# Patient Record
Sex: Female | Born: 1975 | Race: Black or African American | Hispanic: No | Marital: Single | Smoking: Never smoker
Health system: Southern US, Community
[De-identification: ages and names within clinical notes are randomized; demographics above are authoritative.]

## PROBLEM LIST (undated history)

## (undated) DIAGNOSIS — R51 Headache: Secondary | ICD-10-CM

## (undated) DIAGNOSIS — I10 Essential (primary) hypertension: Secondary | ICD-10-CM

## (undated) HISTORY — PX: OVARIAN CYST REMOVAL: SHX89

## (undated) HISTORY — DX: Essential (primary) hypertension: I10

---

## 1996-01-12 HISTORY — PX: OOPHORECTOMY: SHX86

## 1997-10-26 ENCOUNTER — Emergency Department (HOSPITAL_COMMUNITY): Admission: EM | Admit: 1997-10-26 | Discharge: 1997-10-26 | Payer: Self-pay | Admitting: Emergency Medicine

## 1998-08-14 ENCOUNTER — Inpatient Hospital Stay (HOSPITAL_COMMUNITY): Admission: AD | Admit: 1998-08-14 | Discharge: 1998-08-14 | Payer: Self-pay | Admitting: Obstetrics & Gynecology

## 1998-08-15 ENCOUNTER — Ambulatory Visit (HOSPITAL_COMMUNITY): Admission: RE | Admit: 1998-08-15 | Discharge: 1998-08-15 | Payer: Self-pay | Admitting: Obstetrics & Gynecology

## 1998-08-15 ENCOUNTER — Encounter: Payer: Self-pay | Admitting: Obstetrics & Gynecology

## 1998-12-26 ENCOUNTER — Inpatient Hospital Stay (HOSPITAL_COMMUNITY): Admission: AD | Admit: 1998-12-26 | Discharge: 1998-12-26 | Payer: Self-pay | Admitting: Obstetrics & Gynecology

## 1999-05-26 ENCOUNTER — Emergency Department (HOSPITAL_COMMUNITY): Admission: EM | Admit: 1999-05-26 | Discharge: 1999-05-26 | Payer: Self-pay | Admitting: Emergency Medicine

## 1999-09-18 ENCOUNTER — Inpatient Hospital Stay (HOSPITAL_COMMUNITY): Admission: AD | Admit: 1999-09-18 | Discharge: 1999-09-18 | Payer: Self-pay | Admitting: Obstetrics & Gynecology

## 1999-09-23 ENCOUNTER — Inpatient Hospital Stay (HOSPITAL_COMMUNITY): Admission: RE | Admit: 1999-09-23 | Discharge: 1999-09-23 | Payer: Self-pay | Admitting: Obstetrics & Gynecology

## 1999-10-01 ENCOUNTER — Encounter: Admission: RE | Admit: 1999-10-01 | Discharge: 1999-10-01 | Payer: Self-pay | Admitting: Obstetrics

## 2000-11-23 ENCOUNTER — Other Ambulatory Visit: Admission: RE | Admit: 2000-11-23 | Discharge: 2000-11-23 | Payer: Self-pay | Admitting: *Deleted

## 2002-10-05 ENCOUNTER — Other Ambulatory Visit: Admission: RE | Admit: 2002-10-05 | Discharge: 2002-10-05 | Payer: Self-pay | Admitting: Gynecology

## 2003-05-01 ENCOUNTER — Other Ambulatory Visit: Admission: RE | Admit: 2003-05-01 | Discharge: 2003-05-01 | Payer: Self-pay | Admitting: Gynecology

## 2003-05-31 ENCOUNTER — Inpatient Hospital Stay (HOSPITAL_COMMUNITY): Admission: AD | Admit: 2003-05-31 | Discharge: 2003-05-31 | Payer: Self-pay | Admitting: Gynecology

## 2003-09-04 ENCOUNTER — Inpatient Hospital Stay (HOSPITAL_COMMUNITY): Admission: AD | Admit: 2003-09-04 | Discharge: 2003-09-04 | Payer: Self-pay | Admitting: Gynecology

## 2003-11-20 ENCOUNTER — Encounter (INDEPENDENT_AMBULATORY_CARE_PROVIDER_SITE_OTHER): Payer: Self-pay | Admitting: *Deleted

## 2003-11-20 ENCOUNTER — Inpatient Hospital Stay (HOSPITAL_COMMUNITY): Admission: RE | Admit: 2003-11-20 | Discharge: 2003-11-23 | Payer: Self-pay | Admitting: Gynecology

## 2004-01-17 ENCOUNTER — Other Ambulatory Visit: Admission: RE | Admit: 2004-01-17 | Discharge: 2004-01-17 | Payer: Self-pay | Admitting: Gynecology

## 2004-04-08 ENCOUNTER — Emergency Department (HOSPITAL_COMMUNITY): Admission: EM | Admit: 2004-04-08 | Discharge: 2004-04-08 | Payer: Self-pay | Admitting: Family Medicine

## 2004-11-08 ENCOUNTER — Inpatient Hospital Stay (HOSPITAL_COMMUNITY): Admission: AD | Admit: 2004-11-08 | Discharge: 2004-11-10 | Payer: Self-pay | Admitting: *Deleted

## 2004-11-08 ENCOUNTER — Ambulatory Visit: Payer: Self-pay | Admitting: *Deleted

## 2004-11-12 ENCOUNTER — Inpatient Hospital Stay (HOSPITAL_COMMUNITY): Admission: AD | Admit: 2004-11-12 | Discharge: 2004-11-12 | Payer: Self-pay | Admitting: Obstetrics & Gynecology

## 2005-11-16 ENCOUNTER — Emergency Department (HOSPITAL_COMMUNITY): Admission: EM | Admit: 2005-11-16 | Discharge: 2005-11-16 | Payer: Self-pay | Admitting: Emergency Medicine

## 2006-09-09 ENCOUNTER — Other Ambulatory Visit: Admission: RE | Admit: 2006-09-09 | Discharge: 2006-09-09 | Payer: Self-pay | Admitting: Gynecology

## 2007-11-13 ENCOUNTER — Encounter: Payer: Self-pay | Admitting: Women's Health

## 2007-11-13 ENCOUNTER — Ambulatory Visit: Payer: Self-pay | Admitting: Women's Health

## 2007-11-13 ENCOUNTER — Other Ambulatory Visit: Admission: RE | Admit: 2007-11-13 | Discharge: 2007-11-13 | Payer: Self-pay | Admitting: Gynecology

## 2008-02-21 ENCOUNTER — Emergency Department (HOSPITAL_COMMUNITY): Admission: EM | Admit: 2008-02-21 | Discharge: 2008-02-21 | Payer: Self-pay | Admitting: Family Medicine

## 2008-07-11 ENCOUNTER — Ambulatory Visit: Payer: Self-pay | Admitting: Women's Health

## 2008-08-15 ENCOUNTER — Ambulatory Visit: Payer: Self-pay | Admitting: Women's Health

## 2008-12-01 ENCOUNTER — Emergency Department (HOSPITAL_COMMUNITY): Admission: EM | Admit: 2008-12-01 | Discharge: 2008-12-01 | Payer: Self-pay | Admitting: Family Medicine

## 2008-12-03 ENCOUNTER — Emergency Department (HOSPITAL_COMMUNITY): Admission: EM | Admit: 2008-12-03 | Discharge: 2008-12-03 | Payer: Self-pay | Admitting: Emergency Medicine

## 2009-02-13 ENCOUNTER — Other Ambulatory Visit: Admission: RE | Admit: 2009-02-13 | Discharge: 2009-02-13 | Payer: Self-pay | Admitting: Gynecology

## 2009-02-13 ENCOUNTER — Ambulatory Visit: Payer: Self-pay | Admitting: Women's Health

## 2009-04-24 ENCOUNTER — Ambulatory Visit: Payer: Self-pay | Admitting: Women's Health

## 2009-06-25 ENCOUNTER — Ambulatory Visit: Payer: Self-pay | Admitting: Women's Health

## 2009-11-18 ENCOUNTER — Inpatient Hospital Stay (HOSPITAL_COMMUNITY): Admission: AD | Admit: 2009-11-18 | Discharge: 2009-11-18 | Payer: Self-pay | Admitting: Obstetrics & Gynecology

## 2009-11-18 ENCOUNTER — Ambulatory Visit: Payer: Self-pay | Admitting: Nurse Practitioner

## 2010-03-25 LAB — URINALYSIS, ROUTINE W REFLEX MICROSCOPIC
Bilirubin Urine: NEGATIVE
Glucose, UA: NEGATIVE mg/dL
Ketones, ur: NEGATIVE mg/dL
Leukocytes, UA: NEGATIVE
Nitrite: NEGATIVE
Protein, ur: NEGATIVE mg/dL
Specific Gravity, Urine: 1.025 (ref 1.005–1.030)
Urobilinogen, UA: 1 mg/dL (ref 0.0–1.0)
pH: 6 (ref 5.0–8.0)

## 2010-03-25 LAB — COMPREHENSIVE METABOLIC PANEL
BUN: 5 mg/dL — ABNORMAL LOW (ref 6–23)
CO2: 25 mEq/L (ref 19–32)
Chloride: 105 mEq/L (ref 96–112)
Creatinine, Ser: 0.61 mg/dL (ref 0.4–1.2)
GFR calc non Af Amer: >60 mL/min (ref >60)
Glucose, Bld: 86 mg/dL (ref 70–99)
Total Bilirubin: 0.2 mg/dL — ABNORMAL LOW (ref 0.3–1.2)

## 2010-03-25 LAB — CBC
Hemoglobin: 12.8 g/dL (ref 12.0–15.0)
MCH: 29.1 pg (ref 26.0–34.0)
MCV: 87.5 fL (ref 78.0–100.0)
RBC: 4.39 MIL/uL (ref 3.87–5.11)

## 2010-03-25 LAB — URINE MICROSCOPIC-ADD ON

## 2010-03-25 LAB — DIFFERENTIAL
Basophils Absolute: 0 10*3/uL (ref 0.0–0.1)
Eosinophils Relative: 7 % — ABNORMAL HIGH (ref 0–5)
Lymphocytes Relative: 35 % (ref 12–46)
Neutro Abs: 2.5 10*3/uL (ref 1.7–7.7)
Neutrophils Relative %: 47 % (ref 43–77)

## 2010-03-25 LAB — WET PREP, GENITAL
Trich, Wet Prep: NONE SEEN
Yeast Wet Prep HPF POC: NONE SEEN

## 2010-03-25 LAB — GC/CHLAMYDIA PROBE AMP, GENITAL: GC Probe Amp, Genital: NEGATIVE

## 2010-04-15 LAB — POCT CARDIAC MARKERS
CKMB, poc: <1 ng/mL — ABNORMAL LOW (ref 1.0–8.0)
Myoglobin, poc: 32 ng/mL (ref 12–200)
Troponin i, poc: <0.05 ng/mL (ref 0.00–0.09)

## 2010-04-15 LAB — POCT I-STAT, CHEM 8
BUN: 12 mg/dL (ref 6–23)
BUN: 5 mg/dL — ABNORMAL LOW (ref 6–23)
Calcium, Ion: 0.93 mmol/L — ABNORMAL LOW (ref 1.12–1.32)
Chloride: 106 mEq/L (ref 96–112)
Creatinine, Ser: 0.8 mg/dL (ref 0.4–1.2)
Creatinine, Ser: 0.8 mg/dL (ref 0.4–1.2)
Glucose, Bld: 88 mg/dL (ref 70–99)
HCT: 41 % (ref 36.0–46.0)
Hemoglobin: 13.9 g/dL (ref 12.0–15.0)
Potassium: 3.6 mEq/L (ref 3.5–5.1)
Sodium: 136 mEq/L (ref 135–145)
Sodium: 140 mEq/L (ref 135–145)
TCO2: 22 mmol/L (ref 0–100)
TCO2: 24 mmol/L (ref 0–100)

## 2010-05-29 NOTE — Op Note (Signed)
NAMEMARCIANNA, Kerry Salazar         ACCOUNT NO.:  [id number]   MEDICAL RECORD NO.:  [id number]          PATIENT TYPE:  INP   LOCATION:  9128                          FACILITY:  [facility]   PHYSICIAN:  Timothy P. Fontaine, M.D.DATE OF BIRTH:  1975/12/28   DATE OF PROCEDURE:  11/20/2003  DATE OF DISCHARGE:                                 OPERATIVE REPORT   PREOPERATIVE DIAGNOSES:  Term pregnancy, pregnancy induced hypertension,  nonreassuring fetal heart rate tracing.   POSTOPERATIVE DIAGNOSES:  Term pregnancy, pregnancy induced hypertension,  nonreassuring fetal heart rate tracing.   PROCEDURE:  Primary low transverse cervical cesarean section.   SURGEON:  Timothy P. Fontaine, M.D.   ASSISTANT:  Scrub technician.   ANESTHESIA:  Epidural.   ESTIMATED BLOOD LOSS:  Less than 500 mL.   COMPLICATIONS:  None.   SPECIMENS:  Samples of cord blood and placenta.   FINDINGS:  At 76 normal female, Apgar's 9 and 9, weight 7 pounds 7 ounces,  nuchal cord x1 noted. Right fallopian tube and ovary appear absent on  inspection.  Left fallopian tube and ovary grossly normal. The patient has a  history of questionable dermoid on the left and inspection of the ovary  shows no gross evidence of cystic changes.   DESCRIPTION OF PROCEDURE:  The patient was taken to the operating room,  underwent dosing of her epidural catheter, placed in the left tilt supine  position, received an abdominal preparation with Betadine solution. A Foley  catheter had been previously placed on labor and delivery. The patient was  draped in the usual fashion. The patient's abdomen was sharply entered  through a repeat midline incision excising the old scar upon entry.  The  bladder flap was sharply and bluntly developed without difficulty. The  uterus was sharply entered and the lower uterine segment bluntly extended  laterally. The bulging membranes were ruptured, fluid noted to be clear. The  infant's head delivered  through the incision, nares and mouth suctioned, the  nuchal cord was reduced, the rest of the infant delivered, cord doubly  clamped and cut and the infant handed to pediatric's in attendance. Samples  of cord blood were obtained, placenta was spontaneously extruded and noted  to be intact and was sent to pathology. The patient received 1 g of Ancef  antibiotic prophylaxis at this time. The uterine cavity was explored with a  sponge to remove all placental membrane fragments.  The uterus was  exteriorized and the uterine incision was closed in one layer using #0  Vicryl suture in a running interlocking stitch. A figure-of-eight  interrupted suture was subsequently placed to achieve hemostasis.  Inspection of the adnexa was then performed. She had an absent right  fallopian tube and ovary. The left fallopian tube and ovary were grossly  normal. There was no evidence of dermoid as previously found on ultrasound  and no further investigation was undertaken at this time. The uterus was  returned to the abdomen which was copiously irrigated, adequate hemostasis  visualized the anterior fascia reapproximated using #0  PDS in a running stitch. The subcutaneous tissue was irrigated, the  subcutaneous tissues  were reapproximated using 3-0 chromic in interrupted  stitch and the skin was reapproximated with staples.  A sterile dressing  applied. The patient taken to the recovery room in good condition having  tolerated the procedure well.     Timo   TPF/MEDQ  D:  11/20/2003  T:  11/20/2003  Job:  [id number]

## 2010-05-29 NOTE — Discharge Summary (Signed)
Kerry Salazar, Kerry Salazar         ACCOUNT NO.:  [id number]   MEDICAL RECORD NO.:  [id number]          PATIENT TYPE:  INP   LOCATION:  9128                          [facility]  [facility]   PHYSICIAN:  Juan H. Lily Peer, M.D.DATE OF BIRTH:  1975/10/15   DATE OF ADMISSION:  11/20/2003  DATE OF DISCHARGE:  11/23/2003                                 DISCHARGE SUMMARY   HISTORY:  The patient is a 35 year old gravida 4, para 1 admitted for  induction as a result of labile PIH at 40-1/2 weeks' gestation.  The patient  underwent a primary cesarean section as a result of a non-reassuring fetal  heart rate tracing by Dr. Colin Broach on November 20, 2003.  The patient  delivered a viable female infant with Apgars of 9 and 9 and intraoperative  findings of what appears to be a right dermoid cyst.  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  The  patient will be followed up 6 weeks later postpartum.  Hospitalization was  essentially unremarkable.  After 24 hours, the Foley was removed.  Her  hemoglobin was 7.3, preop was 8.6.  She was ambulating and was asymptomatic.  Her lochia continued to decrease.  On postop day #2, she continued to remain  afebrile.  Her diet was advanced.  On the third postoperative day, she was  ready to be discharged home.  The midline incision was intact.  She will be  discharged home today with removal of her staples in 48 hours in the office.  Her blood type is B positive, rubella immune.  She was instructed to  continue her prenatal vitamins and iron, and she was given a prescription  for Tylox to take 1 p.o. q.4-6 h. p.r.n. pain and follow up in 6 weeks with  an ultrasound to assess that left ovarian cyst.     Juan   JHF/MEDQ  D:  11/23/2003  T:  11/23/2003  Job:  [id number]

## 2010-05-29 NOTE — Consult Note (Signed)
NAME:  Kerry Salazar, SWINT                   ACCOUNT NO.:  [id number]   MEDICAL RECORD NO.:  [id number]                   PATIENT TYPE:  MAT   LOCATION:  MATC                                 [facility]ACILITY:  [facility]   PHYSICIAN:  Timothy P. Fontaine, M.D.           DATE OF BIRTH:  Jul 14, 1975   DATE OF CONSULTATION:  09/04/2003  DATE OF DISCHARGE:                                   CONSULTATION   OB/GYN CONSULTATION - EMERGENCY ROOM   CHIEF COMPLAINT:  Abdominal pain.   HISTORY OF PRESENT ILLNESS:  The patient is a 35 year old G12, P50, female at  [redacted] weeks gestation who had the acute onset of upper mid abdominal pain while  she was driving.  The patient notes it was sharp, stabbing which led to her  emergency room presentation.  The patient does note that this lasted for  approximately 20 to 30 minutes and about the time she arrived at the  emergency department her pain had resolved.  She had no nausea or vomiting.  She had no history of trauma.  No vaginal bleeding, contractions or  cramping.  No association with eating.  The patient's pregnancy has been  uncomplicated to date.   PAST MEDICAL HISTORY:  For the remainder of the patient's past medical  history, past obstetrical history, see her [place] form.   PHYSICAL EXAMINATION:  VITAL SIGNS:  Afebrile and vital signs are stable.  HEENT:  Normal.  LUNGS: Clear.  CARDIAC:  Regular rate without murmurs, rubs or gallops.  ABDOMEN:  Benign noting abdomen shows active bowel sounds, is soft,  nontender without rebound or guarding or organomegaly. Her uterus is soft,  again without tenderness.  PELVIC:  Deferred.  Her external monitor showed no regular contractions with  a reactive fetal tracing.   ASSESSMENT/PLAN:  35 year old with transient mid upper epigastric  discomfort, I suspect secondary to gas, quickly resolved with no residual  discomfort, no vaginal bleeding or contractions to suggest obstetrically  related.  Her fetus is  reactive.  Pelvic was not done given her history.  For completeness, will check a Kleihauer-Betke and obstetrical ultrasound to  rule out placental abnormalities and also a cervical length check as a  baseline.  Assuming these are negative, will discharge her home with return  precautions.                                               Timothy P. Audie Box, M.D.    TPF/MEDQ  D:  09/04/2003  T:  09/04/2003  Job:  [id number]

## 2010-05-29 NOTE — H&P (Signed)
Kerry Salazar, Kerry Salazar         ACCOUNT NO.:  [id number]   MEDICAL RECORD NO.:  [id number]           PATIENT TYPE:   LOCATION:                                 FACILITY:   PHYSICIAN:  Ivor Costa. Farrel Gobble, M.D. DATE OF BIRTH:  [birth year]   DATE OF ADMISSION:  DATE OF DISCHARGE:                                HISTORY & PHYSICAL   CHIEF COMPLAINT:  40 plus weeks, elevated blood pressure.   HISTORY OF PRESENT ILLNESS:  The patient is a 35 year old, G4, P10 with an  LMP of February 20, 2003, estimated date of confinement November 18, 2003,  estimated gestational age of 17 2/7 weeks who has been followed in the  office since 36 weeks with NST's and AFI's because of elevated blood  pressure.  At that point, her blood pressure was 154/96 and 132/96. The  patient had PIH labs which were normal. She was taken out of work and  followed with heightened surveillance.  The patient is without any  complaints, no headaches, blurred vision, nausea or vomiting at this point.  She reports good fetal movement. She is B positive, antibody negative, RPR  nonreactive, rubella immune, hepatitis B surface antigen nonreactive, HIV  nonreactive and GBS positive.  Refer to the [place].   PHYSICAL EXAMINATION:  GENERAL:  She is a well appearing gravida in no acute  distress. Her weight is 190 which is a 47 pound weight gain.  Her urine dip  was negative.  VITAL SIGNS:  Her blood pressure is 124/88.  HEART:  Regular rate.  LUNGS:  Clear to auscultation.  ABDOMEN:  Soft, nontender with no right upper quadrant pain.  PELVIC:  Vaginal exam she was 2-3, 50% and -2.  EXTREMITIES:  No edema. Nonstress test was reactive.   ASSESSMENT:  Term pregnancy with elevated blood pressures, favorable cervix.  She will be admitted for high dose Pitocin and rupture on the 9th.     Trac   THL/MEDQ  D:  11/19/2003  T:  11/19/2003  Job:  [id number]

## 2010-11-10 DIAGNOSIS — Z634 Disappearance and death of family member: Secondary | ICD-10-CM | POA: Insufficient documentation

## 2010-11-10 DIAGNOSIS — I10 Essential (primary) hypertension: Secondary | ICD-10-CM | POA: Insufficient documentation

## 2010-11-12 ENCOUNTER — Encounter: Payer: Self-pay | Admitting: Women's Health

## 2011-11-07 ENCOUNTER — Encounter (HOSPITAL_COMMUNITY): Payer: Self-pay | Admitting: Emergency Medicine

## 2011-11-07 ENCOUNTER — Emergency Department (HOSPITAL_COMMUNITY): Payer: Medicaid Other

## 2011-11-07 ENCOUNTER — Emergency Department (HOSPITAL_COMMUNITY)
Admission: EM | Admit: 2011-11-07 | Discharge: 2011-11-07 | Disposition: A | Discharge status: Home / Self Care | Payer: Medicaid Other | Attending: Emergency Medicine | Admitting: Emergency Medicine

## 2011-11-07 DIAGNOSIS — I10 Essential (primary) hypertension: Secondary | ICD-10-CM | POA: Insufficient documentation

## 2011-11-07 DIAGNOSIS — Z9079 Acquired absence of other genital organ(s): Secondary | ICD-10-CM | POA: Insufficient documentation

## 2011-11-07 DIAGNOSIS — R269 Unspecified abnormalities of gait and mobility: Secondary | ICD-10-CM | POA: Insufficient documentation

## 2011-11-07 DIAGNOSIS — M25569 Pain in unspecified knee: Secondary | ICD-10-CM

## 2011-11-07 MED ORDER — IBUPROFEN 600 MG PO TABS: 600.0000 mg | ORAL_TABLET | Freq: Four times a day (QID) | ORAL | Status: DC | PRN

## 2011-11-07 MED ORDER — IBUPROFEN 400 MG PO TABS
600.0000 mg | ORAL_TABLET | Freq: Once | ORAL | Status: AC
  Administered 2011-11-07: 600 mg via ORAL
  Filled 2011-11-07: qty 1

## 2011-11-07 MED ORDER — HYDROCODONE-ACETAMINOPHEN 5-325 MG PO TABS: ORAL_TABLET | ORAL | Status: DC

## 2011-11-07 NOTE — ED Provider Notes (Signed)
History     CSN: [id number]  Arrival date & time 11/07/11  4098   First MD Initiated Contact with Patient 11/07/11 [phone]      Chief Complaint  Patient presents with  . Knee Pain    Left knee    (Consider location/radiation/quality/duration/timing/severity/associated sxs/prior treatment) HPI Comments: Patient presents with complaint of left knee pain that began yesterday morning when she awoke. Patient denies injury to the knee. Patient states pain is located in the medial aspect of her joint. She denies any swelling or redness of the knee. She denies fever history of diabetes. Patient states that the pain is much worse to bear weight and walk on it. No treatments prior to arrival. Nothing makes the pain better. Onset was acute. Course is constant. Patient does not have a history of knee problems.  The history is provided by the patient.    Past Medical History  Diagnosis Date  . Hypertension     Past Surgical History  Procedure Date  . Oophorectomy 1998    RSO    Family History  Problem Relation Age of Onset  . Heart defect Son     History  Substance Use Topics  . Smoking status: Unknown If Ever Smoked  . Smokeless tobacco: Not on file  . Alcohol Use: No    OB History    Grav Para Term Preterm Abortions TAB SAB Ect Mult Living   5 3   2     2       Review of Systems  Constitutional: Negative for activity change.  HENT: Negative for neck pain.   Musculoskeletal: Positive for arthralgias and gait problem. Negative for back pain and joint swelling.  Skin: Negative for wound.  Neurological: Negative for weakness and numbness.    Allergies  Review of patient's allergies indicates no known allergies.  Home Medications   Current Outpatient Rx  Name Route Sig Dispense Refill  . LISINOPRIL-HYDROCHLOROTHIAZIDE PO Oral Take 1 tablet by mouth daily.      BP 115/74  Pulse 101  Temp 98.2 F (36.8 C) (Oral)  Resp 18  SpO2 100%  LMP 11/07/2011  Physical Exam   Nursing note and vitals reviewed. Constitutional: She appears well-developed and well-nourished.  HENT:  Head: Normocephalic and atraumatic.  Eyes: Pupils are equal, round, and reactive to light.  Neck: Normal range of motion. Neck supple.  Cardiovascular: Exam reveals no decreased pulses.   Pulses:      Dorsalis pedis pulses are 2+ on the right side, and 2+ on the left side.       Posterior tibial pulses are 2+ on the right side, and 2+ on the left side.  Musculoskeletal: She exhibits tenderness. She exhibits no edema.       Left hip: Normal.       Left knee: She exhibits normal range of motion, no swelling and no effusion. tenderness found. Medial joint line tenderness noted. No lateral joint line and no patellar tendon tenderness noted.       Left ankle: Normal.  Neurological: She is alert. No sensory deficit.       Motor, sensation, and vascular distal to the injury is fully intact.   Skin: Skin is warm and dry.  Psychiatric: She has a normal mood and affect.    ED Course  Procedures (including critical care time)  Labs Reviewed - No data to display Dg Knee Complete 4 Views Left  11/07/2011  *RADIOLOGY REPORT*  Clinical Data: The medial left  knee pain with walking.  No known injury.  LEFT KNEE - COMPLETE 4+ VIEW  Comparison: None.  Findings: Focal area sclerosis and lucency in the medial femoral condyle suggesting osteochondral defect.  Small exostosis arising from the medial tibial metaphysis.  No evidence of acute fracture or subluxation.  Bone cortex and trabecular architecture appear intact.  No significant effusion.  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 Benign-appearing exostosis in the medial tibial metaphysis.  No acute fracture.   Original Report Authenticated By: Marlon Pel, M.D.      1. Knee pain     4:18 AM Patient seen and examined. X-ray results reviewed. Patient informed of degenerative findings. Crutches and knee  sleeve per ortho tech.   Vital signs reviewed and are as follows: Filed Vitals:   11/07/11 0055  BP: 115/74  Pulse: 101  Temp: 98.2 F (36.8 C)  Resp: 18   Patient to do trial of conservative management. If she is not noticing improvement, she is to follow-up with ortho. ,  Patient verbalizes understanding and agrees with plan.   Patient counseled on use of narcotic pain medications. Counseled not to combine these medications with others containing tylenol. Urged not to drink alcohol, drive, or perform any other activities that requires focus while taking these medications. The patient verbalizes understanding and agrees with the plan.     MDM  Knee pain, no acute injury. Pain may correspond to 'osteochondral defect'. Conservative mgmt with ortho f/u if not improving.         Renne Crigler, [place] 11/08/11 [phone]

## 2011-11-07 NOTE — ED Provider Notes (Signed)
Medical screening examination/treatment/procedure(s) were performed by non-physician practitioner and as supervising physician I was immediately available for consultation/collaboration.   Brandt Loosen, MD 11/07/11 [phone]

## 2011-11-07 NOTE — Progress Notes (Signed)
Orthopedic Tech Progress Note Patient Details:  Kerry Salazar 11/15/1975 [id number]  Ortho Devices Type of Ortho Device: Crutches;Knee Sleeve   [name] 11/07/2011, 4:38 AM

## 2011-11-07 NOTE — ED Notes (Signed)
Pt states pain started in her L knee yesterday suddenly. Reports pain of 10/10 when she puts pressure on L knee. Pt denies injury.

## 2012-07-02 ENCOUNTER — Inpatient Hospital Stay (HOSPITAL_COMMUNITY): Payer: Medicaid Other

## 2012-07-02 ENCOUNTER — Encounter (HOSPITAL_COMMUNITY): Payer: Self-pay | Admitting: *Deleted

## 2012-07-02 ENCOUNTER — Inpatient Hospital Stay (HOSPITAL_COMMUNITY)
Admission: AD | Admit: 2012-07-02 | Discharge: 2012-07-02 | Disposition: A | Discharge status: Home / Self Care | Payer: Medicaid Other | Source: Ambulatory Visit | Attending: Obstetrics & Gynecology | Admitting: Obstetrics & Gynecology

## 2012-07-02 DIAGNOSIS — N949 Unspecified condition associated with female genital organs and menstrual cycle: Secondary | ICD-10-CM | POA: Insufficient documentation

## 2012-07-02 DIAGNOSIS — N760 Acute vaginitis: Secondary | ICD-10-CM | POA: Insufficient documentation

## 2012-07-02 DIAGNOSIS — B9689 Other specified bacterial agents as the cause of diseases classified elsewhere: Secondary | ICD-10-CM | POA: Insufficient documentation

## 2012-07-02 DIAGNOSIS — A499 Bacterial infection, unspecified: Secondary | ICD-10-CM | POA: Insufficient documentation

## 2012-07-02 DIAGNOSIS — N83209 Unspecified ovarian cyst, unspecified side: Secondary | ICD-10-CM | POA: Insufficient documentation

## 2012-07-02 DIAGNOSIS — N938 Other specified abnormal uterine and vaginal bleeding: Secondary | ICD-10-CM | POA: Insufficient documentation

## 2012-07-02 HISTORY — DX: Headache: R51

## 2012-07-02 LAB — WET PREP, GENITAL
Trich, Wet Prep: NONE SEEN
Yeast Wet Prep HPF POC: NONE SEEN

## 2012-07-02 LAB — CBC
MCH: 27.3 pg (ref 26.0–34.0)
MCHC: 33.1 g/dL (ref 30.0–36.0)
MCV: 82.3 fL (ref 78.0–100.0)
Platelets: 291 10*3/uL (ref 150–400)
RDW: 14.6 % (ref 11.5–15.5)

## 2012-07-02 LAB — URINALYSIS, ROUTINE W REFLEX MICROSCOPIC
Leukocytes, UA: NEGATIVE
Nitrite: NEGATIVE
Specific Gravity, Urine: 1.015 (ref 1.005–1.030)
Urobilinogen, UA: 0.2 mg/dL (ref 0.0–1.0)

## 2012-07-02 LAB — POCT PREGNANCY, URINE: Preg Test, Ur: NEGATIVE

## 2012-07-02 LAB — URINE MICROSCOPIC-ADD ON

## 2012-07-02 MED ORDER — METRONIDAZOLE 500 MG PO TABS: 500.0000 mg | ORAL_TABLET | Freq: Two times a day (BID) | ORAL | Status: DC

## 2012-07-02 MED ORDER — MEDROXYPROGESTERONE ACETATE 10 MG PO TABS: 10.0000 mg | ORAL_TABLET | Freq: Every day | ORAL | Status: DC

## 2012-07-02 NOTE — MAU Note (Signed)
Pt stated she has been bleeding since 6/1. Has happen to her once before and was given some hormone pills and the bleeding stopped.

## 2012-07-02 NOTE — MAU Provider Note (Signed)
History     CSN: [id number]  Arrival date and time: 07/02/12 1613   First Provider Initiated Contact with Patient 07/02/12 1726      Chief Complaint  Patient presents with  . Vaginal Bleeding   HPI Ms. Kerry Salazar is a 37 y.o. [id number] who presents to MAU today with complaint of vaginal bleeding x 3 weeks. The patient states a previous similar episode occurred in January and she had a normal endometrial biopsy by her PCP and was given "a hormone pill and a bacteria shot" and her bleeding stopped within 3 days. She states that her bleeding with this episode is sometimes heavy, but not always and has been present daily since 06/11/12. She denies weakness, dizziness, fatigue, N/V, abdominal pain, fever or UTI symptoms.   OB History   Grav Para Term Preterm Abortions TAB SAB Ect Mult Living   5 3 3  2     2       Past Medical History  Diagnosis Date  . Hypertension   . [id number](213.0)     Past Surgical History  Procedure Laterality Date  . Oophorectomy  1998    RSO  . Ovarian cyst removal    . Cesarean section      Family History  Problem Relation Age of Onset  . Heart defect Son     History  Substance Use Topics  . Smoking status: Never Smoker   . Smokeless tobacco: Not on file  . Alcohol Use: No    Allergies: No Known Allergies  Prescriptions prior to admission  Medication Sig Dispense Refill  . acetaminophen (TYLENOL) 325 MG tablet Take 650 mg by mouth every 6 (six) hours as needed for pain.      . Aspirin-Salicylamide-Caffeine (BC HEADACHE POWDER PO) Take 1 each by mouth daily as needed (heacache).        Review of Systems  Constitutional: Negative for fever and malaise/fatigue.  Gastrointestinal: Negative for nausea, vomiting and abdominal pain.  Genitourinary: Negative for dysuria, urgency and frequency.       + vaginal bleeding Neg - vaginal discharge  Neurological: Negative for dizziness, loss of consciousness and weakness.   Physical Exam    Blood pressure 133/91, pulse 84, temperature 98.4 F (36.9 C), temperature source Oral, resp. rate 18, height 5\' 7"  (1.702 m), weight 146 lb 6.4 oz (66.407 kg), last menstrual period 06/11/2012.  Physical Exam  Constitutional: She is oriented to person, place, and time. She appears well-developed and well-nourished. No distress.  HENT:  Head: Normocephalic and atraumatic.  Cardiovascular: Normal rate, regular rhythm and normal heart sounds.   Respiratory: Effort normal and breath sounds normal. No respiratory distress.  GI: Soft. Bowel sounds are normal. She exhibits no distension and no mass. There is no tenderness. There is no rebound and no guarding.  Genitourinary: Uterus is not enlarged and not tender. Cervix exhibits no motion tenderness, no discharge and no friability. Right adnexum displays no mass and no tenderness. Left adnexum displays no mass and no tenderness. There is bleeding (small amount of bleeding noted in the vagina) around the vagina. No vaginal discharge found.  Neurological: She is alert and oriented to person, place, and time.  Skin: Skin is warm and dry. No erythema.  Psychiatric: She has a normal mood and affect.   Results for orders placed during the hospital encounter of 07/02/12 (from the past 24 hour(s))  URINALYSIS, ROUTINE W REFLEX MICROSCOPIC     Status: Abnormal   Collection Time  07/02/12  4:30 PM      Result Value Range   Color, Urine YELLOW  YELLOW   APPearance CLEAR  CLEAR   Specific Gravity, Urine 1.015  1.005 - 1.030   pH 7.0  5.0 - 8.0   Glucose, UA NEGATIVE  NEGATIVE mg/dL   Hgb urine dipstick LARGE (*) NEGATIVE   Bilirubin Urine NEGATIVE  NEGATIVE   Ketones, ur NEGATIVE  NEGATIVE mg/dL   Protein, ur NEGATIVE  NEGATIVE mg/dL   Urobilinogen, UA 0.2  0.0 - 1.0 mg/dL   Nitrite NEGATIVE  NEGATIVE   Leukocytes, UA NEGATIVE  NEGATIVE  URINE MICROSCOPIC-ADD ON     Status: None   Collection Time    07/02/12  4:30 PM      Result Value Range    Squamous Epithelial / LPF RARE  RARE   RBC / HPF 0-2  <3 RBC/hpf   Bacteria, UA RARE  RARE  POCT PREGNANCY, URINE     Status: None   Collection Time    07/02/12  4:36 PM      Result Value Range   Preg Test, Ur NEGATIVE  NEGATIVE  WET PREP, GENITAL     Status: Abnormal   Collection Time    07/02/12  5:40 PM      Result Value Range   Yeast Wet Prep HPF POC NONE SEEN  NONE SEEN   Trich, Wet Prep NONE SEEN  NONE SEEN   Clue Cells Wet Prep HPF POC MANY (*) NONE SEEN   WBC, Wet Prep HPF POC FEW (*) NONE SEEN  CBC     Status: Abnormal   Collection Time    07/02/12  7:00 PM      Result Value Range   WBC 6.5  4.0 - 10.5 K/uL   RBC 4.36  3.87 - 5.11 MIL/uL   Hemoglobin 11.9 (*) 12.0 - 15.0 g/dL   HCT 16.1 (*) 09.6 - 04.5 %   MCV 82.3  78.0 - 100.0 fL   MCH 27.3  26.0 - 34.0 pg   MCHC 33.1  30.0 - 36.0 g/dL   RDW 40.9  81.1 - 91.4 %   Platelets 291  150 - 400 K/uL   US Transvaginal Non-ob  07/02/2012   *RADIOLOGY REPORT*  Clinical Data: Bleeding for 3 weeks.  menorrhagia  TRANSABDOMINAL AND TRANSVAGINAL ULTRASOUND OF PELVIS  Technique:  Both transabdominal and transvaginal ultrasound examinations of the pelvis were performed.  Transabdominal technique was performed for global imaging of the pelvis including uterus, ovaries, adnexal regions, and pelvic cul-de-sac.  It was necessary to proceed with endovaginal exam following the transabdominal exam to visualize the endometrium, uterus and ovaries.  Comparison:  09/04/2003  Findings: Uterus:  Measures 9.1 x 4.9 x 6.3 cm.  Small intra mural fibroid is noted within the anterior myometrium measuring 1.5 x 1.0 x 1.0 cm.  Endometrium: Appears normal measuring 2.3 mm.  Right ovary: Prior right oophorectomy.  Left ovary: Is enlarged measuring 7 x 3.9 x 7.1 cm. Three complicated cyst within the left ovary noted.  The largest measures 4.3 x 5.4 by 4.1 cm and contains internal areas of reticulation consistent with a hemorrhagic cyst.  Two adjacent cysts are  complicated by areas of hyperechogenicity  Other Findings:  No free fluid  IMPRESSION:  1. Three complicated cysts are noted within the right ovary.  The largest has the typical appearance of a hemorrhagic cyst.  The two smaller cysts have imaging areas of hyperechogenicity suggesting ovarian  dermoid. Presence of ovarian dermoid can be confirmed with pelvic MRI.  Alternatively, follow-up imaging at 12 months would be advised to confirm stability.   Original Report Authenticated By: Signa Kell, M.D.   US Pelvis Complete  07/02/2012   *RADIOLOGY REPORT*  Clinical Data: Bleeding for 3 weeks.  menorrhagia  TRANSABDOMINAL AND TRANSVAGINAL ULTRASOUND OF PELVIS  Technique:  Both transabdominal and transvaginal ultrasound examinations of the pelvis were performed.  Transabdominal technique was performed for global imaging of the pelvis including uterus, ovaries, adnexal regions, and pelvic cul-de-sac.  It was necessary to proceed with endovaginal exam following the transabdominal exam to visualize the endometrium, uterus and ovaries.  Comparison:  09/04/2003  Findings: Uterus:  Measures 9.1 x 4.9 x 6.3 cm.  Small intra mural fibroid is noted within the anterior myometrium measuring 1.5 x 1.0 x 1.0 cm.  Endometrium: Appears normal measuring 2.3 mm.  Right ovary: Prior right oophorectomy.  Left ovary: Is enlarged measuring 7 x 3.9 x 7.1 cm. Three complicated cyst within the left ovary noted.  The largest measures 4.3 x 5.4 by 4.1 cm and contains internal areas of reticulation consistent with a hemorrhagic cyst.  Two adjacent cysts are complicated by areas of hyperechogenicity  Other Findings:  No free fluid  IMPRESSION:  1. Three complicated cysts are noted within the right ovary.  The largest has the typical appearance of a hemorrhagic cyst.  The two smaller cysts have imaging areas of hyperechogenicity suggesting ovarian dermoid. Presence of ovarian dermoid can be confirmed with pelvic MRI.  Alternatively, follow-up  imaging at 12 months would be advised to confirm stability.   Original Report Authenticated By: Signa Kell, M.D.    MAU Course  Procedures None  MDM UPT, UA, Wet prep, GC/Chlamydia, CBC and [place] today Hemodynamically stable today  Assessment and Plan  A: Bacterial vaginosis DUB Ovarian cysts  P: Discharge home Rx for Flagyl and Provera sent to patient's pharmacy Bleeding precautions discussed Patient referred to [facility] clinic for follow-up in 3-4 weeks Patient may return to MAU as needed or if her condition were to change or worsen  Freddi Starr, PA-C  07/02/2012, 7:11 PM

## 2012-07-02 NOTE — MAU Note (Signed)
Denies cramping or urinary problems today.  Says the bleeding is intermittent, sometimes very heavy; saying she goes through a tampon and pad every two hours.

## 2012-07-02 NOTE — MAU Provider Note (Signed)
Attestation of Attending Supervision of Advanced Practitioner (CNM/NP): Evaluation and management procedures were performed by the Advanced Practitioner under my supervision and collaboration. I have reviewed the Advanced Practitioner's note and chart, and I agree with the management and plan.  [employer] H. 7:18 PM   

## 2012-07-03 LAB — GC/CHLAMYDIA PROBE AMP: CT Probe RNA: NEGATIVE

## 2012-07-07 ENCOUNTER — Encounter: Payer: Self-pay | Admitting: Obstetrics & Gynecology

## 2012-07-26 ENCOUNTER — Encounter: Payer: Medicaid Other | Admitting: Obstetrics & Gynecology

## 2013-08-08 ENCOUNTER — Encounter (HOSPITAL_COMMUNITY): Payer: Self-pay | Admitting: Family Medicine

## 2013-08-08 ENCOUNTER — Emergency Department (HOSPITAL_COMMUNITY)
Admission: EM | Admit: 2013-08-08 | Discharge: 2013-08-08 | Disposition: A | Discharge status: Home / Self Care | Payer: Medicaid Other | Source: Home / Self Care | Attending: Family Medicine | Admitting: Family Medicine

## 2013-08-08 DIAGNOSIS — R141 Gas pain: Secondary | ICD-10-CM | POA: Diagnosis not present

## 2013-08-08 DIAGNOSIS — IMO0001 Reserved for inherently not codable concepts without codable children: Secondary | ICD-10-CM

## 2013-08-08 DIAGNOSIS — R142 Eructation: Secondary | ICD-10-CM

## 2013-08-08 DIAGNOSIS — R1084 Generalized abdominal pain: Secondary | ICD-10-CM | POA: Diagnosis not present

## 2013-08-08 DIAGNOSIS — R143 Flatulence: Secondary | ICD-10-CM

## 2013-08-08 DIAGNOSIS — K5900 Constipation, unspecified: Secondary | ICD-10-CM | POA: Diagnosis not present

## 2013-08-08 LAB — POCT PREGNANCY, URINE: Preg Test, Ur: NEGATIVE

## 2013-08-08 LAB — POCT URINALYSIS DIP (DEVICE)
Bilirubin Urine: NEGATIVE
GLUCOSE, UA: NEGATIVE mg/dL
HGB URINE DIPSTICK: NEGATIVE
Ketones, ur: NEGATIVE mg/dL
Leukocytes, UA: NEGATIVE
NITRITE: NEGATIVE
Protein, ur: NEGATIVE mg/dL
Specific Gravity, Urine: 1.015 (ref 1.005–1.030)
UROBILINOGEN UA: 0.2 mg/dL (ref 0.0–1.0)
pH: 7 (ref 5.0–8.0)

## 2013-08-08 MED ORDER — POLYETHYLENE GLYCOL 3350 17 G PO PACK: 17.0000 g | PACK | Freq: Two times a day (BID) | ORAL | Status: DC

## 2013-08-08 MED ORDER — SENNA 8.6 MG PO TABS: 1.0000 | ORAL_TABLET | Freq: Two times a day (BID) | ORAL | Status: DC | PRN

## 2013-08-08 NOTE — ED Provider Notes (Signed)
CSN: [id number]     Arrival date & time 08/08/13  1050 History   First MD Initiated Contact with Patient 08/08/13 1111     Chief Complaint  Patient presents with  . Flank Pain   (Consider location/radiation/quality/duration/timing/severity/associated sxs/prior Treatment) HPI  R flank pain. Started yesterday morning. Unable to sleep at night. Non-radiating. Comes and goes. Sharp. Has not tried taking any medications. Just finished mensrual cycle yesterday. Sexually active but not protecting. Denies fever, vaginal discharge or irritation, nausea, vomiting, diarrhea, dysuria, frequency, constipation, rash, CP, SOB. Reports bi-wkly BM w/ last being yesterday and not painful. Worse w/ certain movements.     Past Medical History  Diagnosis Date  . Hypertension   . [id number](478.2)    Past Surgical History  Procedure Laterality Date  . Oophorectomy  1998    RSO  . Ovarian cyst removal    . Cesarean section     Family History  Problem Relation Age of Onset  . Heart defect Son    History  Substance Use Topics  . Smoking status: Never Smoker   . Smokeless tobacco: Not on file  . Alcohol Use: No   OB History   Grav Para Term Preterm Abortions TAB SAB Ect Mult Living   5 3 3  2     2      Review of Systems Per HPI with all other pertinent systems negative.   Allergies  Review of patient's allergies indicates no known allergies.  Home Medications   Prior to Admission medications   Medication Sig Start Date End Date Taking? Authorizing Provider  acetaminophen (TYLENOL) 325 MG tablet Take 650 mg by mouth every 6 (six) hours as needed for pain.    Historical Provider, MD  Aspirin-Salicylamide-Caffeine (BC HEADACHE POWDER PO) Take 1 each by mouth daily as needed (heacache).    Historical Provider, MD  medroxyPROGESTERone (PROVERA) 10 MG tablet Take 1 tablet (10 mg total) by mouth daily. 07/02/12   Freddi Starr, PA-C  metroNIDAZOLE (FLAGYL) 500 MG tablet Take 1 tablet (500 mg total)  by mouth 2 (two) times daily. 07/02/12   Freddi Starr, PA-C  polyethylene glycol (MIRALAX / Ethelene Hal) packet Take 17 g by mouth 2 (two) times daily. 08/08/13   Ozella Rocks, MD  senna (SENOKOT) 8.6 MG TABS tablet Take 1 tablet (8.6 mg total) by mouth 2 (two) times daily as needed for mild constipation. 08/08/13   Ozella Rocks, MD   BP 144/93  Pulse 74  Temp(Src) 98.1 F (36.7 C) (Oral)  Resp 16  SpO2 100%  LMP 08/03/2013 Physical Exam  Constitutional: She is oriented to person, place, and time. She appears well-developed and well-nourished. No distress.  HENT:  Head: Normocephalic and atraumatic.  Eyes: EOM are normal.  Neck: Normal range of motion.  Cardiovascular: Normal heart sounds and intact distal pulses.   No murmur heard. Pulmonary/Chest: Effort normal and breath sounds normal.  Abdominal: Soft. Bowel sounds are normal. She exhibits no distension. There is no tenderness. There is no rebound and no guarding.  Stool felt in the ascending and descending colon  Musculoskeletal: Normal range of motion. She exhibits no edema and no tenderness.  No CVA tenderness  Neurological: She is oriented to person, place, and time. She exhibits normal muscle tone.  Skin: Skin is warm. No rash noted. She is not diaphoretic. No erythema. No pallor.  Psychiatric: She has a normal mood and affect. Her behavior is normal. Judgment and thought content normal.  ED Course  Procedures (including critical care time) Labs Review Labs Reviewed  POCT URINALYSIS DIP (DEVICE)  POCT PREGNANCY, URINE    Imaging Review No results found.   MDM   1. Gas   2. Constipation, unspecified constipation type   3. Generalized abdominal pain    Abd pain likely from constipation, gas, or msk type pain. Unlikely pregnancy, UTI, Nephrolithiasis, appendicitis, cholecystitis or other acute process Start miralax and senokot BIT  Increase fluid intake adn fiber in diet Precautions given and all questions  answered  Shelly Flattenavid Merrell, MD Family Medicine 08/08/2013, 11:32 AM      Ozella Rocksavid J Merrell, MD 08/08/13 [phone]

## 2013-08-08 NOTE — ED Notes (Signed)
Pt c/o intermittent flank pain onset yest am Reports pain increases w/pressure Denies f/v/n/d, urinary sx Alert w/no signs of acute distress.

## 2013-08-08 NOTE — Discharge Instructions (Signed)
The cause of your abdominal pain is not clear but may be musculoskeletal pain from a muscle spasm, constipation, or gas.  Please start taking Ibuprofen or advil 600-800 mg every 6-8 hours for the pain You can try Gas-x if you'd like To help with the constipation try using a daily fiber supplement and lots of water Start the miralax and senokot twice a day until you have daily soft bowel movements, then decrease to once a day and then only one medication then stop all medications once you have continued daily soft bowel movements Please go to the emergency room if you get worse.   Constipation Constipation is when a person has fewer than three bowel movements a week, has difficulty having a bowel movement, or has stools that are dry, hard, or larger than normal. As people grow older, constipation is more common. If you try to fix constipation with medicines that make you have a bowel movement (laxatives), the problem may get worse. Long-term laxative use may cause the muscles of the colon to become weak. A low-fiber diet, not taking in enough fluids, and taking certain medicines may make constipation worse.  CAUSES   Certain medicines, such as antidepressants, pain medicine, iron supplements, antacids, and water pills.   Certain diseases, such as diabetes, irritable bowel syndrome (IBS), thyroid disease, or depression.   Not drinking enough water.   Not eating enough fiber-rich foods.   Stress or travel.   Lack of physical activity or exercise.   Ignoring the urge to have a bowel movement.   Using laxatives too much.  SIGNS AND SYMPTOMS   Having fewer than three bowel movements a week.   Straining to have a bowel movement.   Having stools that are hard, dry, or larger than normal.   Feeling full or bloated.   Pain in the lower abdomen.   Not feeling relief after having a bowel movement.  DIAGNOSIS  Your health care provider will take a medical history and perform  a physical exam. Further testing may be done for severe constipation. Some tests may include:  A barium enema X-ray to examine your rectum, colon, and, sometimes, your small intestine.   A sigmoidoscopy to examine your lower colon.   A colonoscopy to examine your entire colon. TREATMENT  Treatment will depend on the severity of your constipation and what is causing it. Some dietary treatments include drinking more fluids and eating more fiber-rich foods. Lifestyle treatments may include regular exercise. If these diet and lifestyle recommendations do not help, your health care provider may recommend taking over-the-counter laxative medicines to help you have bowel movements. Prescription medicines may be prescribed if over-the-counter medicines do not work.  HOME CARE INSTRUCTIONS   Eat foods that have a lot of fiber, such as fruits, vegetables, whole grains, and beans.  Limit foods high in fat and processed sugars, such as french fries, hamburgers, cookies, candies, and soda.   A fiber supplement may be added to your diet if you cannot get enough fiber from foods.   Drink enough fluids to keep your urine clear or pale yellow.   Exercise regularly or as directed by your health care provider.   Go to the restroom when you have the urge to go. Do not hold it.   Only take over-the-counter or prescription medicines as directed by your health care provider. Do not take other medicines for constipation without talking to your health care provider first.  SEEK IMMEDIATE MEDICAL CARE IF:  You have bright red blood in your stool.   Your constipation lasts for more than 4 days or gets worse.   You have abdominal or rectal pain.   You have thin, pencil-like stools.   You have unexplained weight loss. MAKE SURE YOU:   Understand these instructions.  Will watch your condition.  Will get help right away if you are not doing well or get worse. Document Released: 09/26/2003  Document Revised: 01/02/2013 Document Reviewed: 10/09/2012 [facility] Patient Information 2015 [employer] [place]. This information is not intended to replace advice given to you by your health care provider. Make sure you discuss any questions you have with your health care provider.

## 2013-08-13 ENCOUNTER — Inpatient Hospital Stay (HOSPITAL_COMMUNITY)
Admission: AD | Admit: 2013-08-13 | Discharge: 2013-08-14 | Disposition: A | Discharge status: Home / Self Care | Payer: Medicaid Other | Source: Ambulatory Visit | Attending: Obstetrics & Gynecology | Admitting: Obstetrics & Gynecology

## 2013-08-13 ENCOUNTER — Encounter (HOSPITAL_COMMUNITY): Payer: Self-pay | Admitting: *Deleted

## 2013-08-13 DIAGNOSIS — R1011 Right upper quadrant pain: Secondary | ICD-10-CM | POA: Insufficient documentation

## 2013-08-13 DIAGNOSIS — M7918 Myalgia, other site: Secondary | ICD-10-CM

## 2013-08-13 LAB — URINE MICROSCOPIC-ADD ON

## 2013-08-13 LAB — POCT PREGNANCY, URINE: Preg Test, Ur: NEGATIVE

## 2013-08-13 LAB — URINALYSIS, ROUTINE W REFLEX MICROSCOPIC
Bilirubin Urine: NEGATIVE
GLUCOSE, UA: NEGATIVE mg/dL
KETONES UR: NEGATIVE mg/dL
LEUKOCYTES UA: NEGATIVE
Nitrite: NEGATIVE
PROTEIN: NEGATIVE mg/dL
Specific Gravity, Urine: <1.005 — ABNORMAL LOW (ref 1.005–1.030)
Urobilinogen, UA: 0.2 mg/dL (ref 0.0–1.0)
pH: 6.5 (ref 5.0–8.0)

## 2013-08-13 NOTE — MAU Note (Signed)
Pt reports rt side pain for the last 8 days, [facility] and was told it was gas pain and discharged but the pain has continued and worsened.

## 2013-08-14 ENCOUNTER — Inpatient Hospital Stay (HOSPITAL_COMMUNITY): Payer: Medicaid Other

## 2013-08-14 DIAGNOSIS — R1011 Right upper quadrant pain: Secondary | ICD-10-CM

## 2013-08-14 LAB — CBC
HCT: 34.4 % — ABNORMAL LOW (ref 36.0–46.0)
HEMOGLOBIN: 11.6 g/dL — AB (ref 12.0–15.0)
MCH: 27.2 pg (ref 26.0–34.0)
MCHC: 33.7 g/dL (ref 30.0–36.0)
MCV: 80.8 fL (ref 78.0–100.0)
Platelets: 320 10*3/uL (ref 150–400)
RBC: 4.26 MIL/uL (ref 3.87–5.11)
RDW: 15.2 % (ref 11.5–15.5)
WBC: 7.5 10*3/uL (ref 4.0–10.5)

## 2013-08-14 LAB — LIPASE, BLOOD: LIPASE: 57 U/L (ref 11–59)

## 2013-08-14 LAB — AMYLASE: AMYLASE: 95 U/L (ref 0–105)

## 2013-08-14 MED ORDER — IBUPROFEN 600 MG PO TABS: 600.0000 mg | ORAL_TABLET | ORAL | Status: DC

## 2013-08-14 MED ORDER — IBUPROFEN 600 MG PO TABS
600.0000 mg | ORAL_TABLET | ORAL | Status: AC
  Administered 2013-08-14: 600 mg via ORAL
  Filled 2013-08-14: qty 1

## 2013-08-14 NOTE — Discharge Instructions (Signed)

## 2013-08-14 NOTE — MAU Provider Note (Signed)
Chief Complaint: Vaginal Bleeding and Abdominal Pain   None    SUBJECTIVE HPI: Kerry Salazar is a 38 y.o. G1P0 who presents to maternity admissions reporting pain in her right upper abdomen x8 days, described as sharp with some burning and intermittent. She denies changes in the pain with meals.  She last ate ~6 hours ago.  She is currently having menstrual bleeding.  She was seen for her abdominal pain at [facility] ED on 7/29 for her symptoms and treated for constipation and gas pain.  She reports she has regular bowel movements and had one this morning and is not constipated. She denies vaginal itching/burning, urinary symptoms, h/a, dizziness, n/v, or fever/chills.      Past Medical History  Diagnosis Date  . UTI (lower urinary tract infection)    No past surgical history on file. History   Social History  . Marital Status: Married    Spouse Name: N/A    Number of Children: N/A  . Years of Education: N/A   Occupational History  . Not on file.   Social History Main Topics  . Smoking status: Never Smoker   . Smokeless tobacco: Not on file  . Alcohol Use: No  . Drug Use: No  . Sexual Activity: Yes    Birth Control/ Protection: IUD   Other Topics Concern  . Not on file   Social History Narrative  . No narrative on file   No current facility-administered medications on file prior to encounter.   Current Outpatient Prescriptions on File Prior to Encounter  Medication Sig Dispense Refill  . cephALEXin (KEFLEX) 500 MG capsule Take 1 capsule (500 mg total) by mouth 3 (three) times daily.  21 capsule  0   No Known Allergies  ROS: Pertinent items in HPI  OBJECTIVE Blood pressure 132/83, pulse 88, temperature 99.8 F (37.7 C), temperature source Oral, resp. rate 18, height 5\' 2"  (1.575 m), weight 83.915 kg (185 lb), last menstrual period 05/14/2013, SpO2 100.00%. GENERAL: Well-developed, well-nourished female in no acute distress.  HEENT: Normocephalic HEART: normal  rate RESP: normal effort ABDOMEN: Soft, non-tender, normal bowel sounds EXTREMITIES: Nontender, no edema NEURO: Alert and oriented SPECULUM EXAM: Deferred  LAB RESULTS Results for orders placed during the hospital encounter of 08/13/13 (from the past 24 hour(s))  URINALYSIS, ROUTINE W REFLEX MICROSCOPIC     Status: Abnormal   Collection Time    08/13/13  9:58 PM      Result Value Ref Range   Color, Urine YELLOW  YELLOW   APPearance CLEAR  CLEAR   Specific Gravity, Urine <1.005 (*) 1.005 - 1.030   pH 6.5  5.0 - 8.0   Glucose, UA NEGATIVE  NEGATIVE mg/dL   Hgb urine dipstick TRACE (*) NEGATIVE   Bilirubin Urine NEGATIVE  NEGATIVE   Ketones, ur NEGATIVE  NEGATIVE mg/dL   Protein, ur NEGATIVE  NEGATIVE mg/dL   Urobilinogen, UA 0.2  0.0 - 1.0 mg/dL   Nitrite NEGATIVE  NEGATIVE   Leukocytes, UA NEGATIVE  NEGATIVE  URINE MICROSCOPIC-ADD ON     Status: None   Collection Time    08/13/13  9:58 PM      Result Value Ref Range   Squamous Epithelial / LPF RARE  RARE   WBC, UA 0-2  <3 WBC/hpf   RBC / HPF 0-2  <3 RBC/hpf   Bacteria, UA RARE  RARE  POCT PREGNANCY, URINE     Status: None   Collection Time    08/13/13 11:23  PM      Result Value Ref Range   Preg Test, Ur NEGATIVE  NEGATIVE  CBC     Status: Abnormal   Collection Time    08/14/13 12:38 AM      Result Value Ref Range   WBC 7.5  4.0 - 10.5 K/uL   RBC 4.26  3.87 - 5.11 MIL/uL   Hemoglobin 11.6 (*) 12.0 - 15.0 g/dL   HCT 96.234.4 (*) 95.236.0 - 84.146.0 %   MCV 80.8  78.0 - 100.0 fL   MCH 27.2  26.0 - 34.0 pg   MCHC 33.7  30.0 - 36.0 g/dL   RDW 32.415.2  40.111.5 - 02.715.5 %   Platelets 320  150 - 400 K/uL    IMAGING Kore[place] Abdomen Limited Ruq  08/14/2013   CLINICAL DATA:  Right upper quadrant pain 3 days  EXAM: US ABDOMEN LIMITED - RIGHT UPPER QUADRANT  COMPARISON:  None.  FINDINGS: Gallbladder:  No gallstones or wall thickening visualized. No sonographic Murphy sign noted.  Common bile duct:  Diameter: 3 mm  Liver:  No focal lesion identified.  Within normal limits in parenchymal echogenicity.  IMPRESSION: Normal   Electronically Signed   By: Marlan Palauharles  Clark M.D.   On: 08/14/2013 01:57    ASSESSMENT 1. Right upper quadrant abdominal pain     PLAN Discharge home Amylase/Lipase pending Ibuprofen for pain F/U with primary care provider Return to MAU or ED with worsening pain    Medication List    ASK your doctor about these medications       cephALEXin 500 MG capsule  Commonly known as:  KEFLEX  Take 1 capsule (500 mg total) by mouth 3 (three) times daily.         Sharen CounterLisa Leftwich-Kirby Certified Nurse-Midwife 08/14/2013  12:38 AM

## 2013-08-15 NOTE — MAU Provider Note (Signed)
Attestation of Attending Supervision of Advanced Practitioner (CNM/NP): Evaluation and management procedures were performed by the Advanced Practitioner under my supervision and collaboration.  I have reviewed the Advanced Practitioner's note and chart, and I agree with the management and plan.  HARRAWAY-SMITH, Quavon Keisling 1:28 PM

## 2013-11-12 ENCOUNTER — Encounter (HOSPITAL_COMMUNITY): Payer: Self-pay | Admitting: *Deleted

## 2013-12-27 ENCOUNTER — Ambulatory Visit: Payer: Self-pay | Admitting: Women's Health

## 2014-01-17 ENCOUNTER — Ambulatory Visit: Payer: Self-pay | Admitting: Women's Health

## 2014-06-14 ENCOUNTER — Encounter: Payer: Self-pay | Admitting: Women's Health

## 2014-06-14 ENCOUNTER — Ambulatory Visit (INDEPENDENT_AMBULATORY_CARE_PROVIDER_SITE_OTHER): Payer: BC Managed Care – PPO | Admitting: Women's Health

## 2014-06-14 DIAGNOSIS — Z3040 Encounter for surveillance of contraceptives, unspecified: Secondary | ICD-10-CM

## 2014-06-14 DIAGNOSIS — N8320 Unspecified ovarian cysts: Secondary | ICD-10-CM

## 2014-06-14 DIAGNOSIS — N83202 Unspecified ovarian cyst, left side: Secondary | ICD-10-CM | POA: Insufficient documentation

## 2014-06-14 DIAGNOSIS — Z113 Encounter for screening for infections with a predominantly sexual mode of transmission: Secondary | ICD-10-CM

## 2014-06-14 LAB — WET PREP FOR TRICH, YEAST, CLUE
Clue Cells Wet Prep HPF POC: NONE SEEN
Trich, Wet Prep: NONE SEEN
Yeast Wet Prep HPF POC: NONE SEEN

## 2014-06-14 MED ORDER — MEDROXYPROGESTERONE ACETATE 150 MG/ML IM SUSP
150.0000 mg | Freq: Once | INTRAMUSCULAR | Status: AC
Start: 2014-06-14 — End: 2014-06-14
  Administered 2014-06-14: 150 mg via INTRAMUSCULAR

## 2014-06-14 MED ORDER — MEDROXYPROGESTERONE ACETATE 150 MG/ML IM SUSP: 150.0000 mg | Freq: Once | INTRAMUSCULAR | Status: DC

## 2014-06-14 NOTE — Progress Notes (Signed)
Patient ID: Kerry Salazar, female   DOB: 02-Dec-1975, 39 y.o.   MRN: [id number] Presents with complaint of increased white discharge. On first day of menstrual cycle. New partner. Would like to get back on Depo-Provera. History of hypertension, currently on no medication, BP today 150/100. Denies vaginal itching or odor, urinary symptoms, abdominal pain or fever. Monthly cycle/condoms. History of RSO. 40 year old son making bad choices currently in jail, 40 year old son doing well.  Exam: Appears well. External genitalia within normal limits, speculum exam moderate amount of menses noted wet prep negative, GC/Chlamydia culture taken. Bimanual uterus nontender, history of left ovarian cyst.  STD screen History of left ovarian cyst Contraception management Hypertension  Plan: Depo-Provera 150 IM every 12 weeks, prescription will pick up and return to office for injection. Instructed to follow-up with primary care for elevated blood pressure and probable meds. Reviewed hazards of untreated hypertension. Return to office for annual exam and will check HIV, hepatitis and RPR. Ultrasound follow-up left ovarian cyst.

## 2014-06-18 LAB — GC/CHLAMYDIA PROBE AMP
CT PROBE, AMP APTIMA: NEGATIVE
GC Probe RNA: NEGATIVE

## 2014-06-19 ENCOUNTER — Telehealth: Payer: Self-pay | Admitting: Women's Health

## 2014-06-19 ENCOUNTER — Ambulatory Visit (INDEPENDENT_AMBULATORY_CARE_PROVIDER_SITE_OTHER): Payer: BC Managed Care – PPO | Admitting: Women's Health

## 2014-06-19 ENCOUNTER — Ambulatory Visit: Payer: BC Managed Care – PPO

## 2014-06-19 DIAGNOSIS — N938 Other specified abnormal uterine and vaginal bleeding: Secondary | ICD-10-CM

## 2014-06-19 MED ORDER — MEGESTROL ACETATE 20 MG PO TABS: 20.0000 mg | ORAL_TABLET | Freq: Every day | ORAL | Status: DC

## 2014-06-19 NOTE — Telephone Encounter (Signed)
Presented to office for ultrasound, refused vaginal ultrasound due to still bleeding afterDepo Provera last week, irritated from bleeding/pads. Megace 20 mg twice daily for until bleeding stops reschedule ultrasound.

## 2014-06-20 NOTE — Progress Notes (Signed)
Patient ID: Kerry Salazar, female   DOB: Apr 28, 1975, 39 y.o.   MRN: [id number] Presented for ultrasound, refused vaginal ultrasound reports having irritation from increased bleeding since Depo-Provera, will reschedule ultrasound for next week, Megace 20 mg twice daily call if bleeding does not stop.

## 2014-07-12 ENCOUNTER — Other Ambulatory Visit (HOSPITAL_COMMUNITY)
Admission: RE | Admit: 2014-07-12 | Discharge: 2014-07-12 | Disposition: A | Discharge status: Home / Self Care | Payer: BC Managed Care – PPO | Source: Ambulatory Visit | Attending: Women's Health | Admitting: Women's Health

## 2014-07-12 ENCOUNTER — Ambulatory Visit (INDEPENDENT_AMBULATORY_CARE_PROVIDER_SITE_OTHER): Payer: BC Managed Care – PPO | Admitting: Women's Health

## 2014-07-12 ENCOUNTER — Encounter: Payer: Self-pay | Admitting: Women's Health

## 2014-07-12 ENCOUNTER — Telehealth: Payer: Self-pay | Admitting: *Deleted

## 2014-07-12 VITALS — BP 122/80 | Ht 68.0 in | Wt 151.0 lb

## 2014-07-12 DIAGNOSIS — Z1151 Encounter for screening for human papillomavirus (HPV): Secondary | ICD-10-CM | POA: Insufficient documentation

## 2014-07-12 DIAGNOSIS — Z13220 Encounter for screening for lipoid disorders: Secondary | ICD-10-CM

## 2014-07-12 DIAGNOSIS — Z3040 Encounter for surveillance of contraceptives, unspecified: Secondary | ICD-10-CM | POA: Diagnosis not present

## 2014-07-12 DIAGNOSIS — I10 Essential (primary) hypertension: Secondary | ICD-10-CM | POA: Diagnosis not present

## 2014-07-12 DIAGNOSIS — Z113 Encounter for screening for infections with a predominantly sexual mode of transmission: Secondary | ICD-10-CM

## 2014-07-12 DIAGNOSIS — Z01419 Encounter for gynecological examination (general) (routine) without abnormal findings: Secondary | ICD-10-CM | POA: Diagnosis not present

## 2014-07-12 LAB — CBC WITH DIFFERENTIAL/PLATELET
BASOS ABS: 0.1 10*3/uL (ref 0.0–0.1)
Basophils Relative: 1 % (ref 0–1)
EOS ABS: 0.3 10*3/uL (ref 0.0–0.7)
Eosinophils Relative: 6 % — ABNORMAL HIGH (ref 0–5)
HCT: 32.2 % — ABNORMAL LOW (ref 36.0–46.0)
Hemoglobin: 10.5 g/dL — ABNORMAL LOW (ref 12.0–15.0)
LYMPHS ABS: 1.9 10*3/uL (ref 0.7–4.0)
LYMPHS PCT: 36 % (ref 12–46)
MCH: 26.3 pg (ref 26.0–34.0)
MCHC: 32.6 g/dL (ref 30.0–36.0)
MCV: 80.5 fL (ref 78.0–100.0)
MONO ABS: 0.5 10*3/uL (ref 0.1–1.0)
MONOS PCT: 9 % (ref 3–12)
MPV: 9.3 fL (ref 8.6–12.4)
NEUTROS ABS: 2.5 10*3/uL (ref 1.7–7.7)
NEUTROS PCT: 48 % (ref 43–77)
PLATELETS: 379 10*3/uL (ref 150–400)
RBC: 4 MIL/uL (ref 3.87–5.11)
RDW: 16.6 % — AB (ref 11.5–15.5)
WBC: 5.3 10*3/uL (ref 4.0–10.5)

## 2014-07-12 LAB — COMPREHENSIVE METABOLIC PANEL
ALT: 9 U/L (ref 0–35)
AST: 11 U/L (ref 0–37)
Albumin: 3.6 g/dL (ref 3.5–5.2)
Alkaline Phosphatase: 45 U/L (ref 39–117)
BUN: 6 mg/dL (ref 6–23)
CALCIUM: 8.6 mg/dL (ref 8.4–10.5)
CHLORIDE: 103 meq/L (ref 96–112)
CO2: 23 meq/L (ref 19–32)
Creat: 0.72 mg/dL (ref 0.50–1.10)
Glucose, Bld: 78 mg/dL (ref 70–99)
POTASSIUM: 3.6 meq/L (ref 3.5–5.3)
Sodium: 141 mEq/L (ref 135–145)
Total Bilirubin: 0.4 mg/dL (ref 0.2–1.2)
Total Protein: 6.9 g/dL (ref 6.0–8.3)

## 2014-07-12 LAB — LIPID PANEL
CHOLESTEROL: 158 mg/dL (ref 0–200)
HDL: 37 mg/dL — ABNORMAL LOW (ref >=46)
LDL Cholesterol: 111 mg/dL — ABNORMAL HIGH (ref 0–99)
Total CHOL/HDL Ratio: 4.3 Ratio
Triglycerides: 50 mg/dL (ref <150)
VLDL: 10 mg/dL (ref 0–40)

## 2014-07-12 LAB — RPR

## 2014-07-12 MED ORDER — LISINOPRIL-HYDROCHLOROTHIAZIDE 20-12.5 MG PO TABS: 1.0000 | ORAL_TABLET | Freq: Every day | ORAL | Status: DC

## 2014-07-12 MED ORDER — MEDROXYPROGESTERONE ACETATE 150 MG/ML IM SUSP: 150.0000 mg | Freq: Once | INTRAMUSCULAR | Status: DC

## 2014-07-12 NOTE — Progress Notes (Signed)
Kerry DockBelinda J Salazar 11/27/1975 [id number]    History:    Presents for annual exam.  Irregular bleeding with Depo-Provera. Negative GC/Chlamydia this month. Did well with Depo-Provera in the past. 2005 hypertension . Normal Pap history. 1998 right oophorectomy for cyst. 2007 son died of congenital heart defect at age 586 months.  Past medical history, past surgical history, family history and social history were all reviewed and documented in the EPIC chart. [place] [employer], oldest son 1921 making poor choices currently in jail. Younger son Kerry Salazar 8311 doing well. Father hypertension. Going to [place] 39[place] birthday.  ROS:  A ROS was performed and pertinent positives and negatives are included.  Exam:  Filed Vitals:   07/12/14 1024  BP: 122/80    General appearance:  Normal Thyroid:  Symmetrical, normal in size, without palpable masses or nodularity. Respiratory  Auscultation:  Clear without wheezing or rhonchi Cardiovascular  Auscultation:  Regular rate, without rubs, murmurs or gallops  Edema/varicosities:  Not grossly evident Abdominal  Soft,nontender, without masses, guarding or rebound.  Liver/spleen:  No organomegaly noted  Hernia:  None appreciated  Skin  Inspection:  Grossly normal   Breasts: Examined lying and sitting.     Right: Without masses, retractions, discharge or axillary adenopathy.     Left: Without masses, retractions, discharge or axillary adenopathy. Gentitourinary   Inguinal/mons:  Normal without inguinal adenopathy  External genitalia:  Normal  BUS/Urethra/Skene's glands:  Normal  Vagina:  Normal  Cervix:  Normal  Uterus:  normal in size, shape and contour.  Midline and mobile  Adnexa/parametria:     Rt: Without masses or tenderness.   Lt: Without masses or tenderness.  Anus and perineum: Normal  Digital rectal exam: Normal sphincter tone without palpated masses or tenderness  Assessment/Plan:  39 y.o.SBF G5P2  for  annual exam.     2005 hypertension Irregular bleeding on Depo-Provera after first injection  STD screen  Plan: Reviewed will only give one month of antihypertensives drug instructed to schedule appointment with primary care for management. SBE's, annual screening mammogram at 39, breast center information given and instructed to schedule. Exercise, calcium rich diet, vitamin D 1000 daily encouraged. Depo-Provera 150 IM every 12 weeks prescription, proper use, importance of calcium rich diet reviewed. Instructed to call if continued problems with irregular bleeding after third injection. CBC, CMP, lipid panel, UA, Pap with HR HPV typing new screening recommendations reviewed. HIV, hep B, C, RPR. (Negative GC/Chlamydia this month)   Kerry Salazar Kerry Salazar, 10:58 AM 07/12/2014

## 2014-07-12 NOTE — Telephone Encounter (Signed)
Please call in lisinopril 12.5 #30 no refills, she is going to schedule follow-up with primary care.

## 2014-07-12 NOTE — Telephone Encounter (Signed)
I called pt and she told me it was 20-12.5 mg lisinopril, rx sent.

## 2014-07-12 NOTE — Patient Instructions (Signed)

## 2014-07-12 NOTE — Telephone Encounter (Signed)
Pt called and said you were going to refill her blood pressure medication lisinopril 12.5 mg? Please advise

## 2014-07-13 LAB — URINALYSIS W MICROSCOPIC + REFLEX CULTURE
BILIRUBIN URINE: NEGATIVE
Bacteria, UA: NONE SEEN
Casts: NONE SEEN
Crystals: NONE SEEN
Glucose, UA: NEGATIVE mg/dL
Hgb urine dipstick: NEGATIVE
Ketones, ur: NEGATIVE mg/dL
LEUKOCYTES UA: NEGATIVE
NITRITE: NEGATIVE
PROTEIN: NEGATIVE mg/dL
Specific Gravity, Urine: 1.024 (ref 1.005–1.030)
UROBILINOGEN UA: 1 mg/dL (ref 0.0–1.0)
pH: 7 (ref 5.0–8.0)

## 2014-07-13 LAB — HEPATITIS B SURFACE ANTIGEN: Hepatitis B Surface Ag: NEGATIVE

## 2014-07-13 LAB — HIV ANTIBODY (ROUTINE TESTING W REFLEX): HIV: NONREACTIVE

## 2014-07-13 LAB — HEPATITIS C ANTIBODY: HCV Ab: NEGATIVE

## 2014-07-17 LAB — CYTOLOGY - PAP

## 2014-10-04 IMAGING — US US PELVIS COMPLETE
1 series · 13 of 25 positions shown · non-contrast
Comparison: 09/04/2003

CLINICAL DATA: Bleeding for 3 weeks.  menorrhagia



[Series 1: us pelvis complete · 13 of 36 slices shown]
[im 1/36]
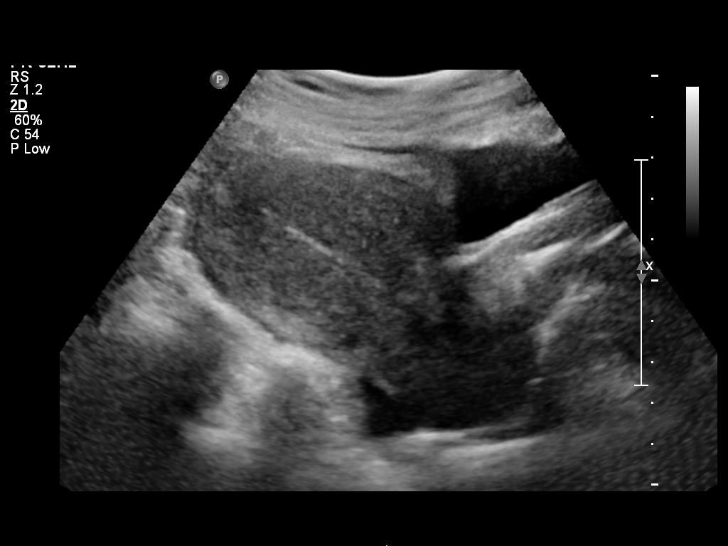
[im 3/36]
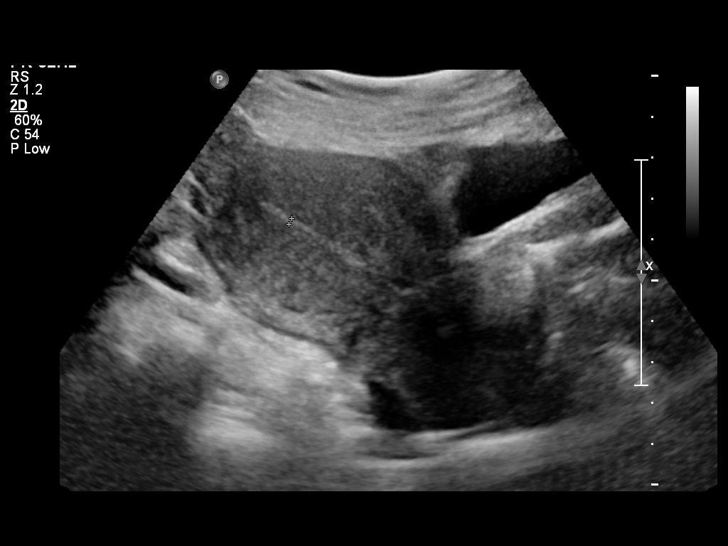
[im 6/36]
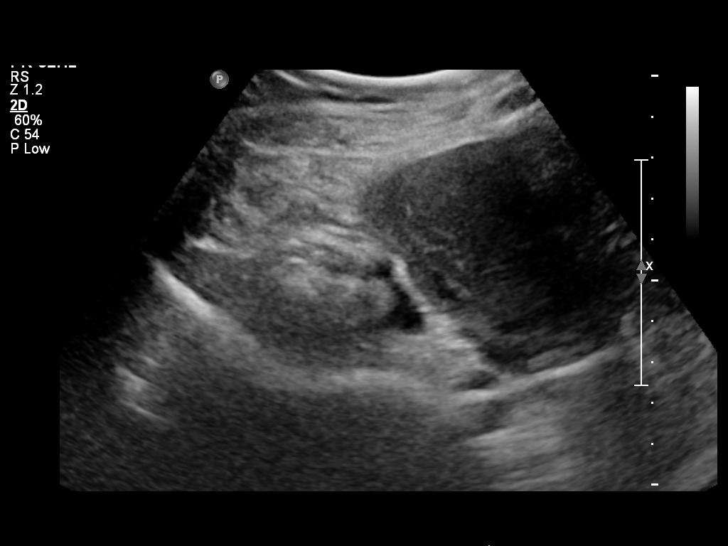
[im 9/36]
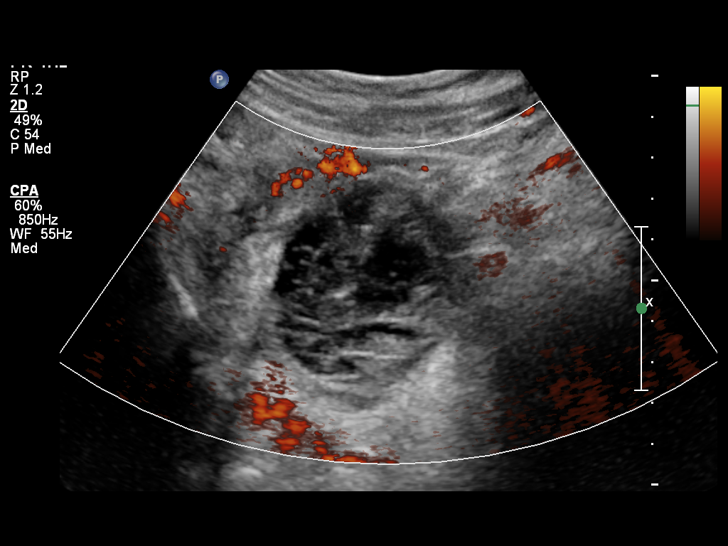
[im 12/36]
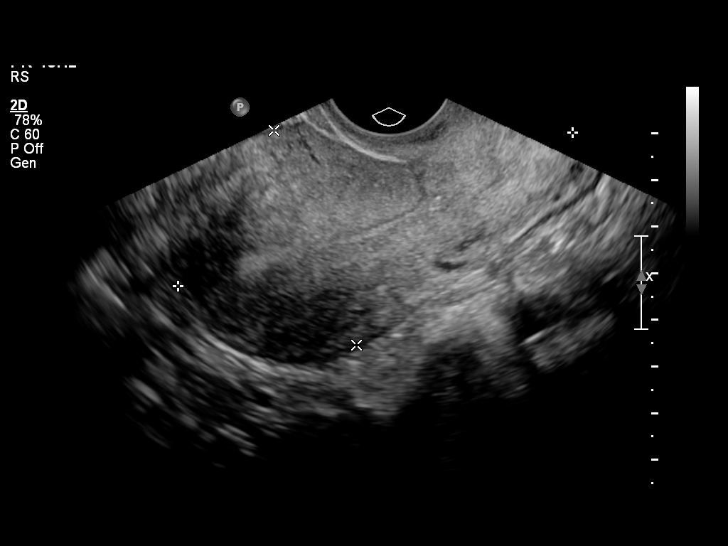
[im 15/36]
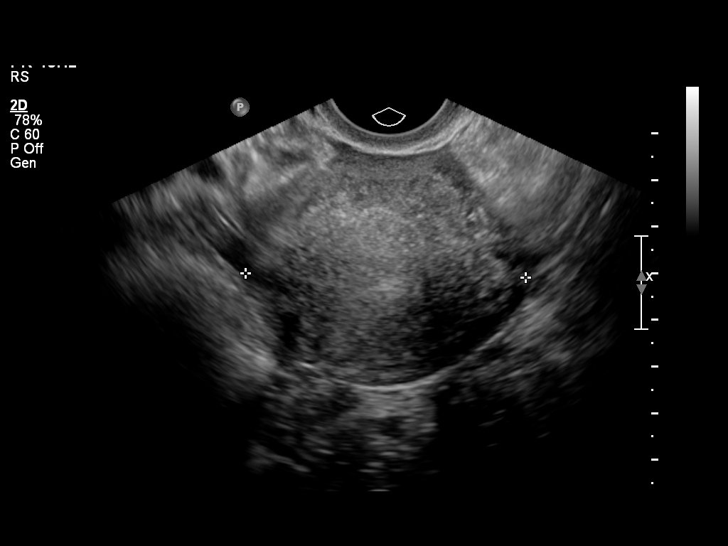
[im 18/36]
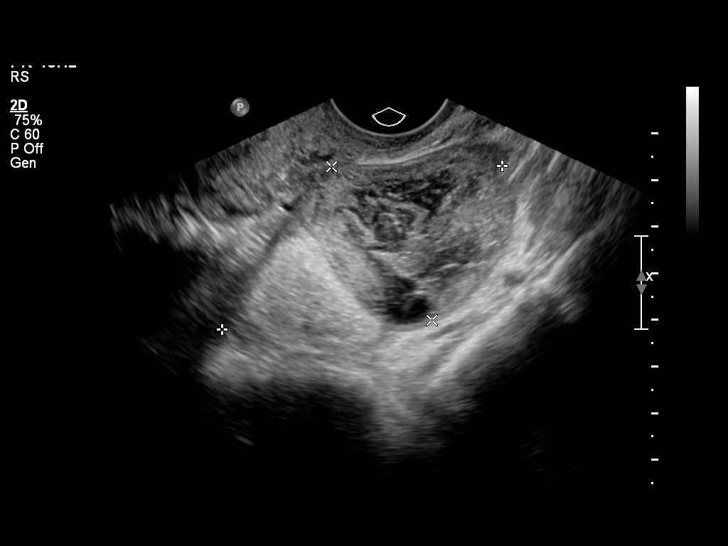
[im 21/36]
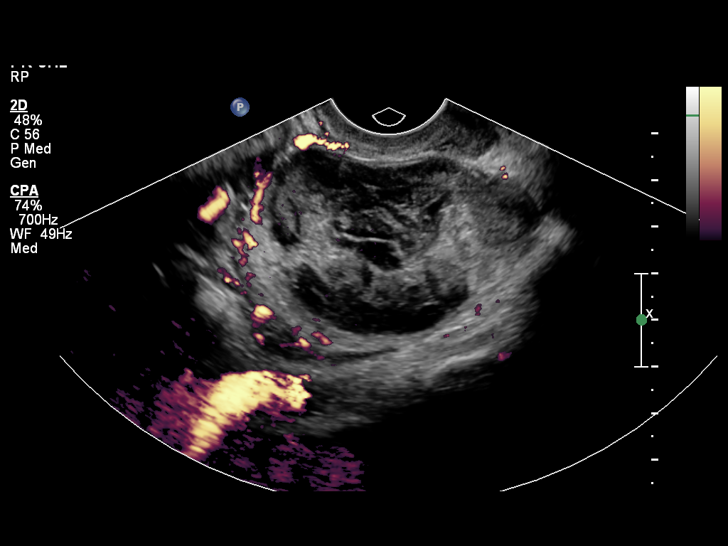
[im 24/36]
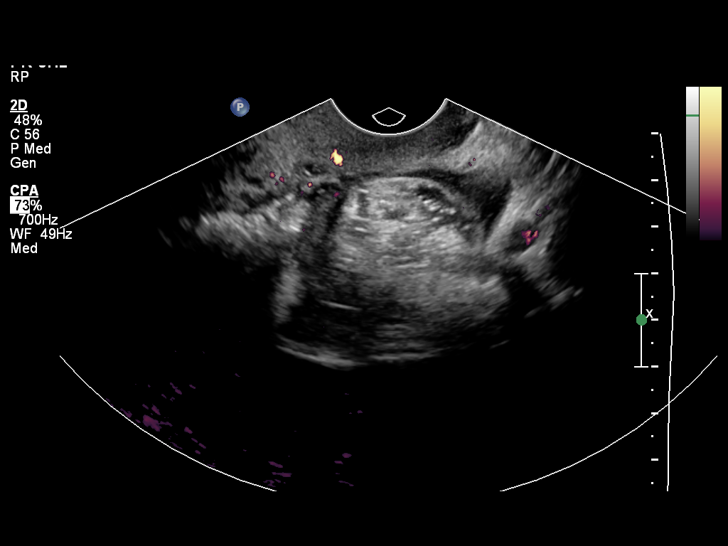
[im 27/36]
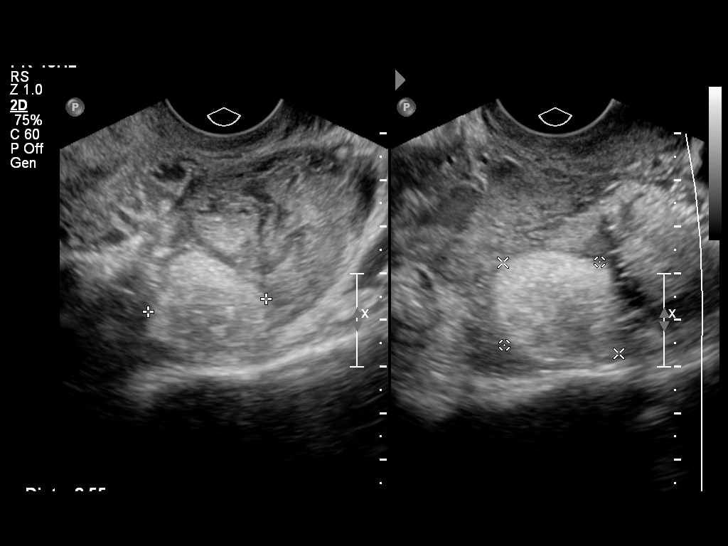
[im 30/36]
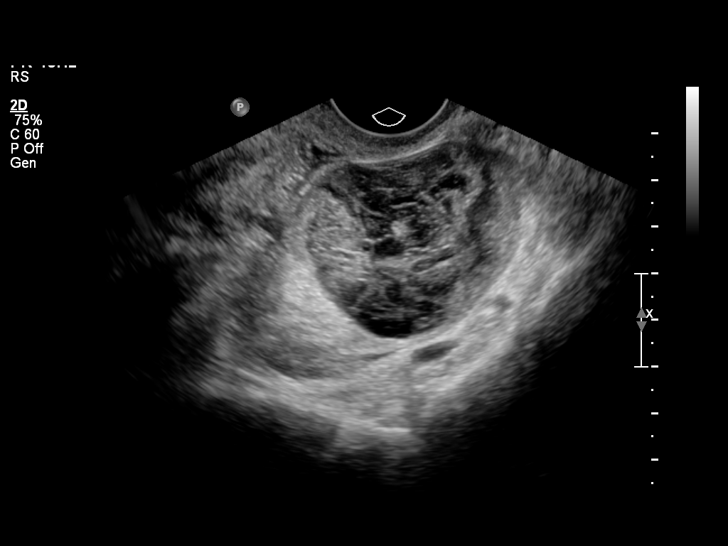
[im 33/36]
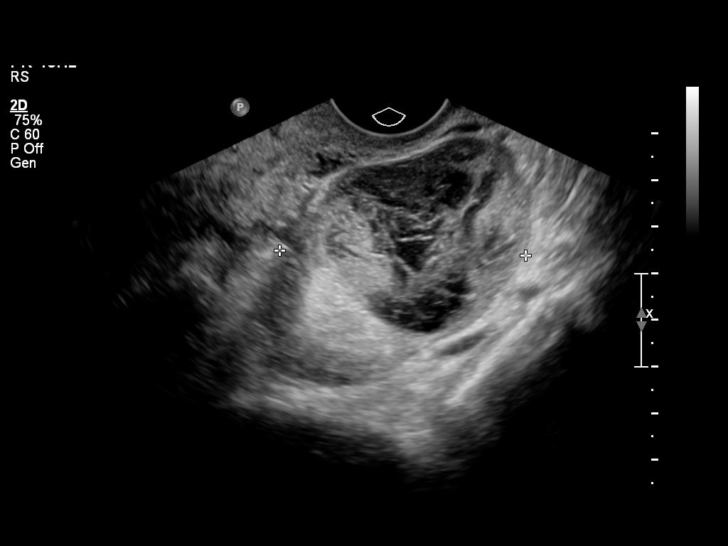
[im 36/36]
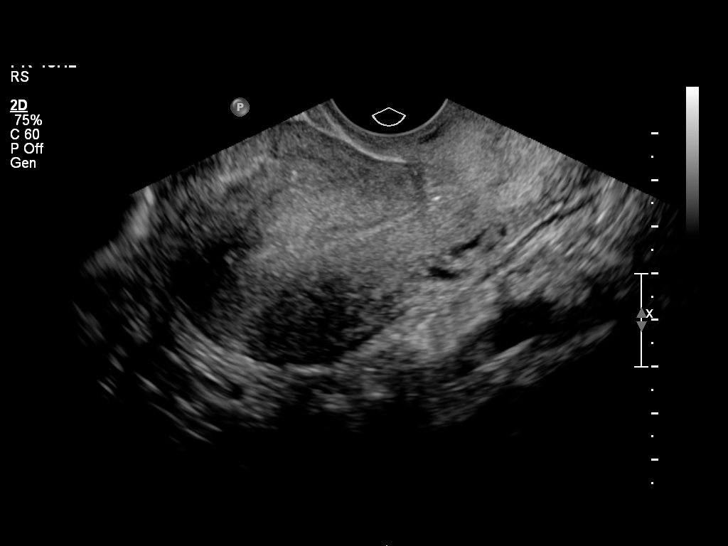

[13 of 25 positions shown; findings below may reference images not displayed]

FINDINGS: Uterus:  Measures 9.1 x 4.9 x 6.3 cm..  Small intra mural fibroid
is noted within the anterior myometrium measuring 1.5 x 1.0 x
cm.

Endometrium: Appears normal measuring 2.3 mm.

Right ovary: Prior right oophorectomy.

Left ovary: Is enlarged measuring 7 x 3.9 x 7.1 cm. Three
complicated cyst within the left ovary noted.  The largest measures
4.3 x 5.4 by 4.1 cm and contains internal areas of reticulation
consistent with a hemorrhagic cyst.  Two adjacent cysts are
complicated by areas of hyperechogenicity

Other Findings:  No free fluid
IMPRESSION: 1. Three complicated cysts are noted within the right ovary.  The
largest has the typical appearance of a hemorrhagic cyst.  The two
smaller cysts have imaging areas of hyperechogenicity suggesting
ovarian dermoid. Presence of ovarian dermoid can be confirmed with
pelvic MRI.  Alternatively, follow-up [HOSPITAL] 12 months would be
advised to confirm stability.

## 2015-03-13 ENCOUNTER — Ambulatory Visit (INDEPENDENT_AMBULATORY_CARE_PROVIDER_SITE_OTHER): Payer: BC Managed Care – PPO | Admitting: Women's Health

## 2015-03-13 ENCOUNTER — Encounter: Payer: Self-pay | Admitting: Women's Health

## 2015-03-13 ENCOUNTER — Ambulatory Visit: Payer: BC Managed Care – PPO | Admitting: Women's Health

## 2015-03-13 VITALS — BP 142/80 | Ht 68.0 in | Wt 151.0 lb

## 2015-03-13 DIAGNOSIS — N760 Acute vaginitis: Secondary | ICD-10-CM

## 2015-03-13 DIAGNOSIS — N938 Other specified abnormal uterine and vaginal bleeding: Secondary | ICD-10-CM | POA: Diagnosis not present

## 2015-03-13 DIAGNOSIS — Z3040 Encounter for surveillance of contraceptives, unspecified: Secondary | ICD-10-CM

## 2015-03-13 DIAGNOSIS — A499 Bacterial infection, unspecified: Secondary | ICD-10-CM

## 2015-03-13 DIAGNOSIS — B9689 Other specified bacterial agents as the cause of diseases classified elsewhere: Secondary | ICD-10-CM

## 2015-03-13 LAB — WET PREP FOR TRICH, YEAST, CLUE
TRICH WET PREP: NONE SEEN
Yeast Wet Prep HPF POC: NONE SEEN

## 2015-03-13 LAB — PREGNANCY, URINE: PREG TEST UR: NEGATIVE

## 2015-03-13 MED ORDER — METRONIDAZOLE 0.75 % VA GEL: VAGINAL | Status: DC

## 2015-03-13 MED ORDER — MEDROXYPROGESTERONE ACETATE 10 MG PO TABS: 10.0000 mg | ORAL_TABLET | Freq: Every day | ORAL | Status: DC

## 2015-03-13 MED ORDER — MEDROXYPROGESTERONE ACETATE 150 MG/ML IM SUSP
150.0000 mg | Freq: Once | INTRAMUSCULAR | Status: DC
Start: 2015-03-13 — End: 2017-06-06

## 2015-03-13 MED ORDER — METRONIDAZOLE 500 MG PO TABS
500.0000 mg | ORAL_TABLET | Freq: Two times a day (BID) | ORAL | Status: DC
Start: 2015-03-13 — End: 2017-06-06

## 2015-03-13 NOTE — Progress Notes (Signed)
Patient ID: Kerry Salazar, female   DOB: 07-May-1975, 41 y.o.   MRN: [id number] Presents with complaint of irregular bleeding/spotting for the past month, many days heavy bleeding, vaginal discharge with odor. Same partner, denies need for STD screen. Last dose of Depo-Provera in August.  Not sexually active in greater than one month. Denies urinary symptoms, abdominal pain or fever, no relief with over-the-counter Monistat.  Exam: Appears well. Abdomen soft nontender, external genitalia within normal limits, speculum exam moderate amount of a yellow/ brown discharge with odor, wet prep positive for many clues, TNTC bacteria. Bimanual no CMT or adnexal tenderness. UPT negative  Bacteria vaginosis Irregular bleeding after Depo-Provera Contraception management  Plan: Flagyl 500 mg by mouth twice daily for 7 days, alcohol precautions reviewed. Provera 10 mg by mouth daily for 10 days, instructed to call if bleeding/spotting does not resolve. Contraception options reviewed will would like to try Nexplanon, reviewed  irregular bleeding, hypertensive well controlled on medication. Instructed to call office first day of bleeding Dr. Audie Box to place with next cycle, will check coverage.

## 2015-03-13 NOTE — Patient Instructions (Addendum)
Bacterial Vaginosis Bacterial vaginosis is a vaginal infection that occurs when the normal balance of bacteria in the vagina is disrupted. It results from an overgrowth of certain bacteria. This is the most common vaginal infection in women of childbearing age. Treatment is important to prevent complications, especially in pregnant women, as it can cause a premature delivery. CAUSES  Bacterial vaginosis is caused by an increase in harmful bacteria that are normally present in smaller amounts in the vagina. Several different kinds of bacteria can cause bacterial vaginosis. However, the reason that the condition develops is not fully understood. RISK FACTORS Certain activities or behaviors can put you at an increased risk of developing bacterial vaginosis, including:  Having a new sex partner or multiple sex partners.  Douching.  Using an intrauterine device (IUD) for contraception. Women do not get bacterial vaginosis from toilet seats, bedding, swimming pools, or contact with objects around them. SIGNS AND SYMPTOMS  Some women with bacterial vaginosis have no signs or symptoms. Common symptoms include:  Grey vaginal discharge.  A fishlike odor with discharge, especially after sexual intercourse.  Itching or burning of the vagina and vulva.  Burning or pain with urination. DIAGNOSIS  Your health care provider will take a medical history and examine the vagina for signs of bacterial vaginosis. A sample of vaginal fluid may be taken. Your health care provider will look at this sample under a microscope to check for bacteria and abnormal cells. A vaginal pH test may also be done.  TREATMENT  Bacterial vaginosis may be treated with antibiotic medicines. These may be given in the form of a pill or a vaginal cream. A second round of antibiotics may be prescribed if the condition comes back after treatment. Because bacterial vaginosis increases your risk for sexually transmitted diseases, getting  treated can help reduce your risk for chlamydia, gonorrhea, HIV, and herpes. HOME CARE INSTRUCTIONS   Only take over-the-counter or prescription medicines as directed by your health care provider.  If antibiotic medicine was prescribed, take it as directed. Make sure you finish it even if you start to feel better.  Tell all sexual partners that you have a vaginal infection. They should see their health care provider and be treated if they have problems, such as a mild rash or itching.  During treatment, it is important that you follow these instructions:  Avoid sexual activity or use condoms correctly.  Do not douche.  Avoid alcohol as directed by your health care provider.  Avoid breastfeeding as directed by your health care provider. SEEK MEDICAL CARE IF:   Your symptoms are not improving after 3 days of treatment.  You have increased discharge or pain.  You have a fever. MAKE SURE YOU:   Understand these instructions.  Will watch your condition.  Will get help right away if you are not doing well or get worse. FOR MORE INFORMATION  Centers for Disease Control and Prevention, Division of STD Prevention: SolutionApps.co.za American Sexual Health Association (ASHA): www.ashastd.org    This information is not intended to replace advice given to you by your health care provider. Make sure you discuss any questions you have with your health care provider.   Document Released: 12/28/2004 Document Revised: 01/18/2014 Document Reviewed: 08/09/2012 Elsevier Interactive Patient Education 2016 [employer]. Etonogestrel implant What is this medicine? ETONOGESTREL (et oh noe JES trel) is a contraceptive (birth control) device. It is used to prevent pregnancy. It can be used for up to 3 years. This medicine may be  used for other purposes; ask your health care provider or pharmacist if you have questions. What should I tell my health care provider before I take this medicine? They need  to know if you have any of these conditions: -abnormal vaginal bleeding -blood vessel disease or blood clots -cancer of the breast, cervix, or liver -depression -diabetes -gallbladder disease -headaches -heart disease or recent heart attack -high blood pressure -high cholesterol -kidney disease -liver disease -renal disease -seizures -tobacco smoker -an unusual or allergic reaction to etonogestrel, other hormones, anesthetics or antiseptics, medicines, foods, dyes, or preservatives -pregnant or trying to get pregnant -breast-feeding How should I use this medicine? This device is inserted just under the skin on the inner side of your upper arm by a health care professional. Talk to your pediatrician regarding the use of this medicine in children. Special care may be needed. Overdosage: If you think you have taken too much of this medicine contact a poison control center or emergency room at once. NOTE: This medicine is only for you. Do not share this medicine with others. What if I miss a dose? This does not apply. What may interact with this medicine? Do not take this medicine with any of the following medications: -amprenavir -bosentan -fosamprenavir This medicine may also interact with the following medications: -barbiturate medicines for inducing sleep or treating seizures -certain medicines for fungal infections like ketoconazole and itraconazole -griseofulvin -medicines to treat seizures like carbamazepine, felbamate, oxcarbazepine, phenytoin, topiramate -modafinil -phenylbutazone -rifampin -some medicines to treat HIV infection like atazanavir, indinavir, lopinavir, nelfinavir, tipranavir, ritonavir -St. John's wort This list may not describe all possible interactions. Give your health care provider a list of all the medicines, herbs, non-prescription drugs, or dietary supplements you use. Also tell them if you smoke, drink alcohol, or use illegal drugs. Some items may  interact with your medicine. What should I watch for while using this medicine? This product does not protect you against HIV infection (AIDS) or other sexually transmitted diseases. You should be able to feel the implant by pressing your fingertips over the skin where it was inserted. Contact your doctor if you cannot feel the implant, and use a non-hormonal birth control method (such as condoms) until your doctor confirms that the implant is in place. If you feel that the implant may have broken or become bent while in your arm, contact your healthcare provider. What side effects may I notice from receiving this medicine? Side effects that you should report to your doctor or health care professional as soon as possible: -allergic reactions like skin rash, itching or hives, swelling of the face, lips, or tongue -breast lumps -changes in emotions or moods -depressed mood -heavy or prolonged menstrual bleeding -pain, irritation, swelling, or bruising at the insertion site -scar at site of insertion -signs of infection at the insertion site such as fever, and skin redness, pain or discharge -signs of pregnancy -signs and symptoms of a blood clot such as breathing problems; changes in vision; chest pain; severe, sudden headache; pain, swelling, warmth in the leg; trouble speaking; sudden numbness or weakness of the face, arm or leg -signs and symptoms of liver injury like dark yellow or brown urine; general ill feeling or flu-like symptoms; light-colored stools; loss of appetite; nausea; right upper belly pain; unusually weak or tired; yellowing of the eyes or skin -unusual vaginal bleeding, discharge -signs and symptoms of a stroke like changes in vision; confusion; trouble speaking or understanding; severe headaches; sudden numbness or weakness of  the face, arm or leg; trouble walking; dizziness; loss of balance or coordination Side effects that usually do not require medical attention (Report  these to your doctor or health care professional if they continue or are bothersome.): -acne -back pain -breast pain -changes in weight -dizziness -general ill feeling or flu-like symptoms -headache -irregular menstrual bleeding -nausea -sore throat -vaginal irritation or inflammation This list may not describe all possible side effects. Call your doctor for medical advice about side effects. You may report side effects to FDA at 1-800-FDA-1088. Where should I keep my medicine? This drug is given in a hospital or clinic and will not be stored at home. NOTE: This sheet is a summary. It may not cover all possible information. If you have questions about this medicine, talk to your doctor, pharmacist, or health care provider.    2016, Elsevier/Gold Standard. (2013-10-12 14:07:06)

## 2015-03-17 ENCOUNTER — Telehealth: Payer: Self-pay | Admitting: Gynecology

## 2015-03-17 NOTE — Telephone Encounter (Signed)
03/17/15-I spoke w/pt to let [facility] know [facility] the Nexplanon for contraception under her $40 copay. She knows to call first day of cycle for insertion with TF. Per [email or web address] , [phone]#[id number]

## 2015-03-21 ENCOUNTER — Ambulatory Visit (INDEPENDENT_AMBULATORY_CARE_PROVIDER_SITE_OTHER): Payer: BC Managed Care – PPO | Admitting: *Deleted

## 2015-03-21 DIAGNOSIS — Z3042 Encounter for surveillance of injectable contraceptive: Secondary | ICD-10-CM

## 2015-03-21 MED ORDER — MEDROXYPROGESTERONE ACETATE 150 MG/ML IM SUSP
150.0000 mg | Freq: Once | INTRAMUSCULAR | Status: AC
  Administered 2015-03-21: 150 mg via INTRAMUSCULAR

## 2015-06-20 ENCOUNTER — Ambulatory Visit (INDEPENDENT_AMBULATORY_CARE_PROVIDER_SITE_OTHER): Payer: BC Managed Care – PPO | Admitting: Gynecology

## 2015-06-20 DIAGNOSIS — Z3042 Encounter for surveillance of injectable contraceptive: Secondary | ICD-10-CM | POA: Diagnosis not present

## 2015-06-20 MED ORDER — MEDROXYPROGESTERONE ACETATE 150 MG/ML IM SUSP
150.0000 mg | Freq: Once | INTRAMUSCULAR | Status: AC
  Administered 2015-06-20: 150 mg via INTRAMUSCULAR

## 2015-07-16 ENCOUNTER — Encounter: Payer: BC Managed Care – PPO | Admitting: Women's Health

## 2015-11-16 IMAGING — US US ABDOMEN LIMITED
1 series · 14 of 25 positions shown · non-contrast
Comparison: None.

CLINICAL DATA: Right upper quadrant pain 3 days

EXAM:
US ABDOMEN LIMITED - RIGHT UPPER QUADRANT

[Series 1: us abdomen limited ruq/ascites · 14 of 48 slices shown]
[im 1/48]
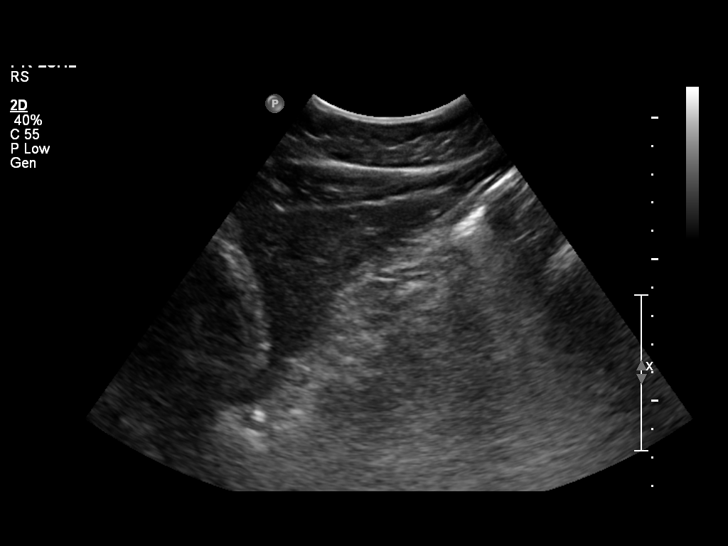
[im 4/48]
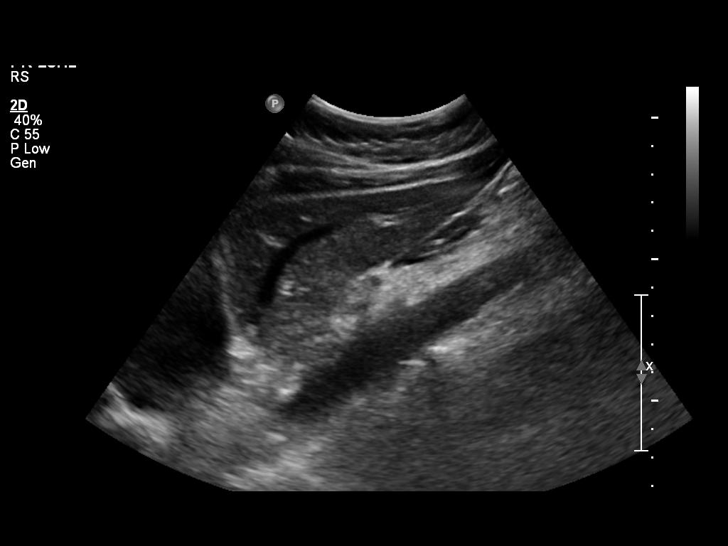
[im 8/48]
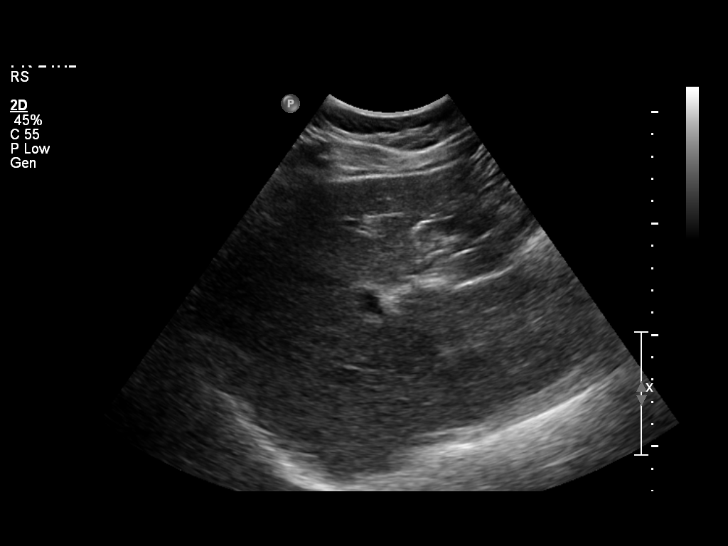
[im 12/48]
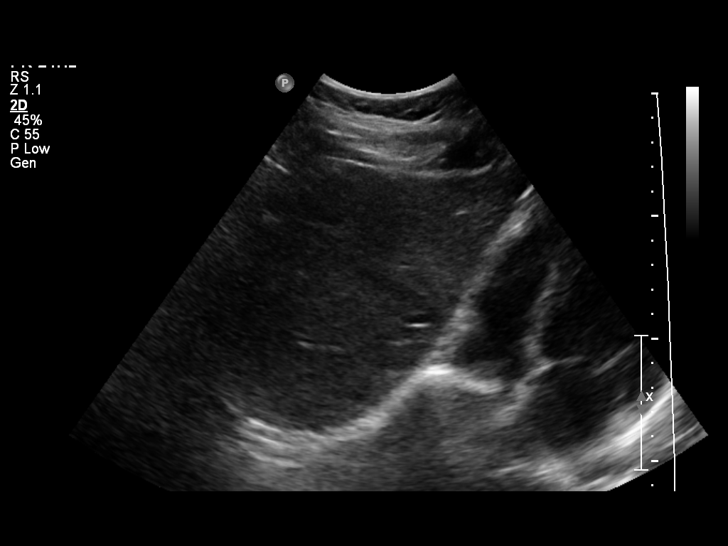
[im 16/48]
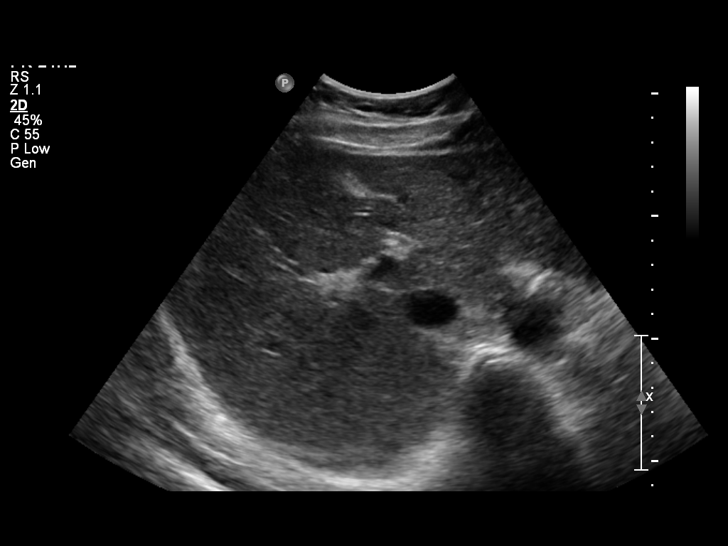
[im 18/48]
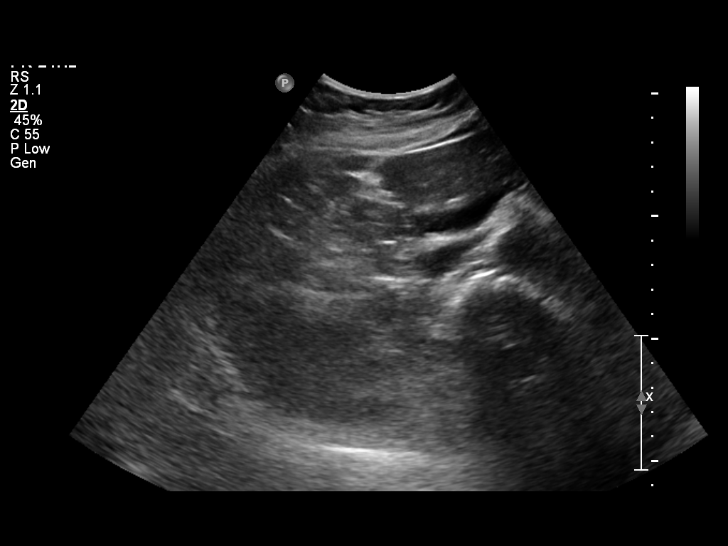
[im 22/48]
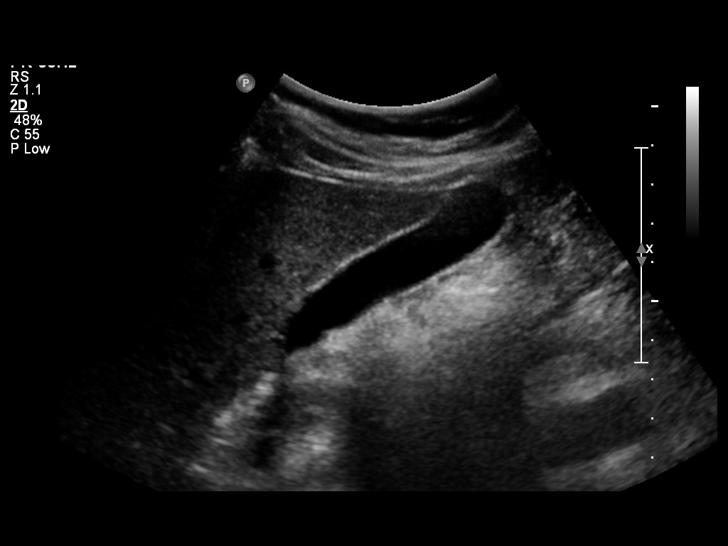
[im 26/48]
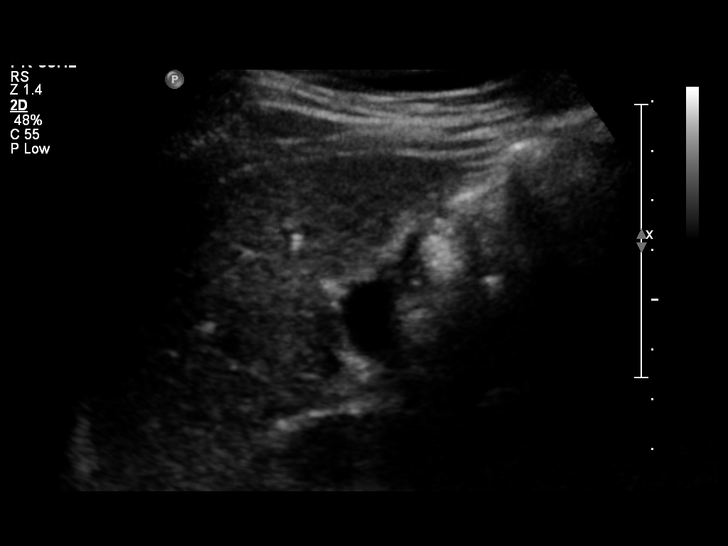
[im 30/48]
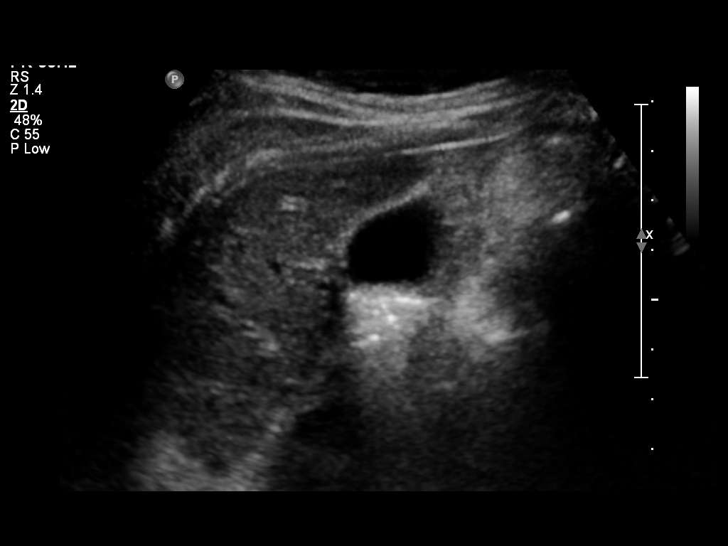
[im 32/48]
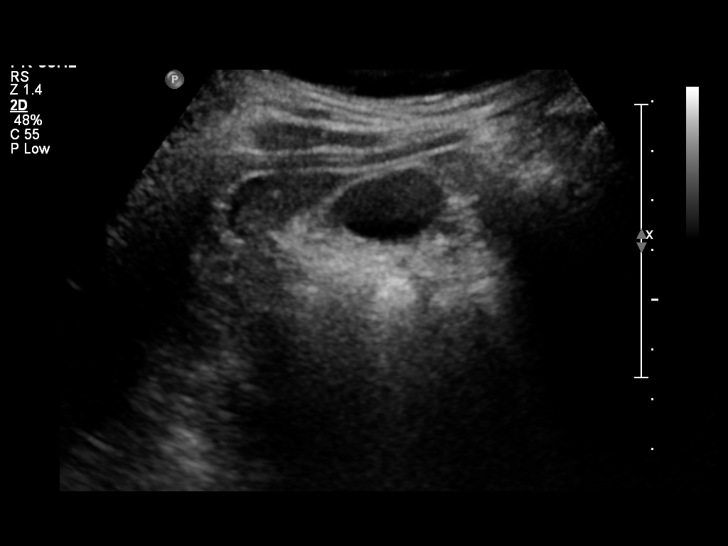
[im 36/48]
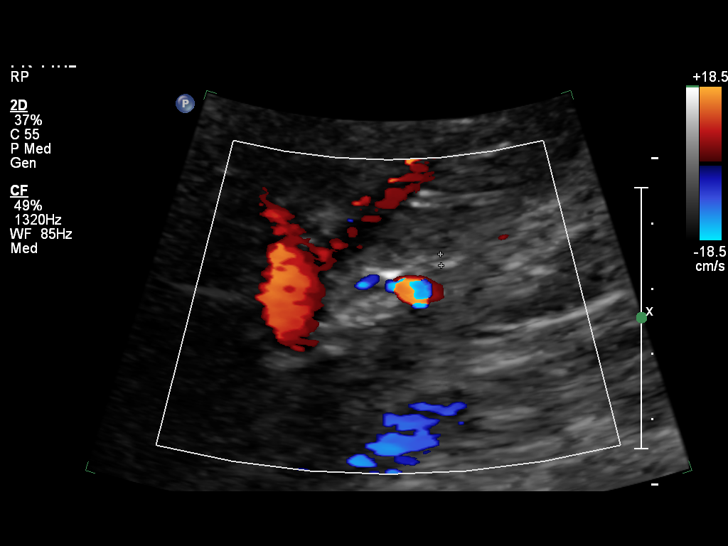
[im 40/48]
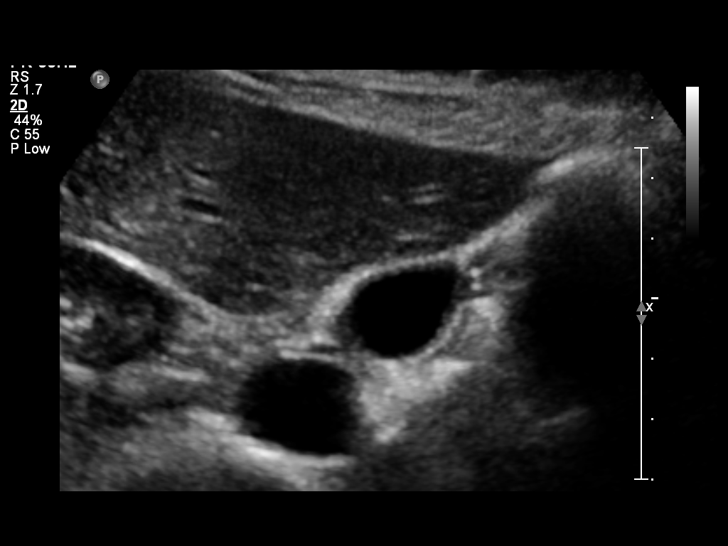
[im 44/48]
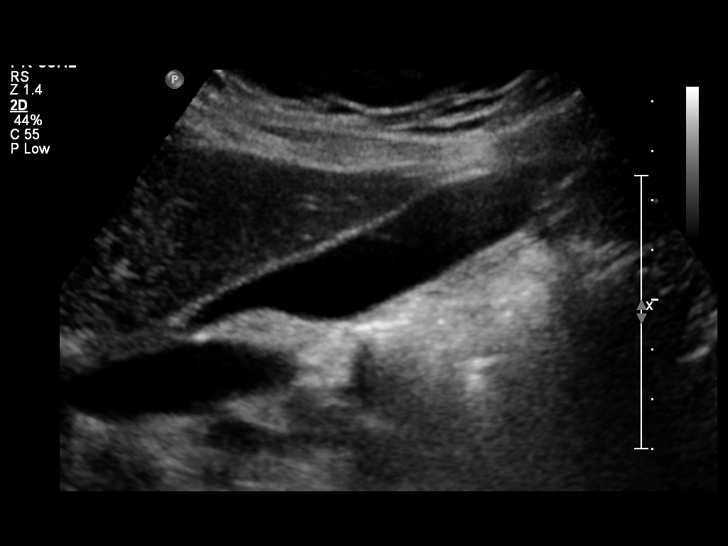
[im 48/48]
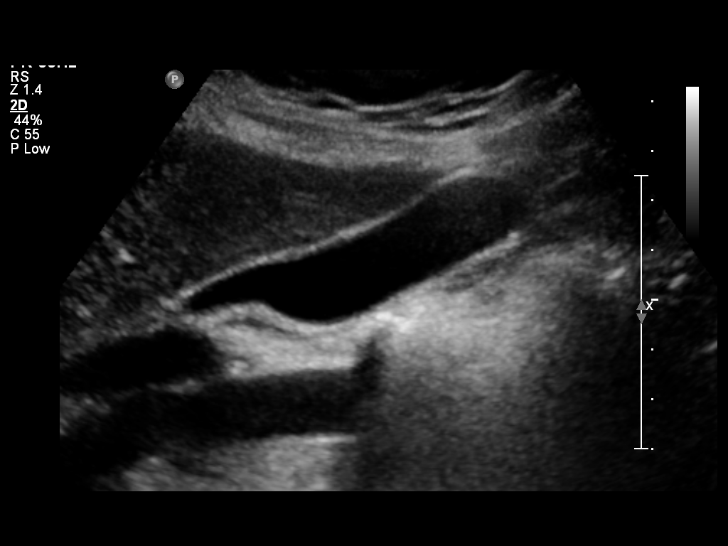

[14 of 25 positions shown; findings below may reference images not displayed]

FINDINGS: Gallbladder:

No gallstones or wall thickening visualized. No sonographic Murphy
sign noted.

Common bile duct:

Diameter: 3 mm

Liver:

No focal lesion identified. Within normal limits in parenchymal
echogenicity.
IMPRESSION: Normal

## 2016-02-18 ENCOUNTER — Emergency Department (HOSPITAL_COMMUNITY)
Admission: EM | Admit: 2016-02-18 | Discharge: 2016-02-19 | Disposition: A | Discharge status: Home / Self Care | Payer: BC Managed Care – PPO | Attending: Emergency Medicine | Admitting: Emergency Medicine

## 2016-02-18 ENCOUNTER — Encounter (HOSPITAL_COMMUNITY): Payer: Self-pay | Admitting: Emergency Medicine

## 2016-02-18 DIAGNOSIS — Z7982 Long term (current) use of aspirin: Secondary | ICD-10-CM | POA: Insufficient documentation

## 2016-02-18 DIAGNOSIS — Z79899 Other long term (current) drug therapy: Secondary | ICD-10-CM | POA: Insufficient documentation

## 2016-02-18 DIAGNOSIS — I10 Essential (primary) hypertension: Secondary | ICD-10-CM | POA: Insufficient documentation

## 2016-02-18 DIAGNOSIS — J069 Acute upper respiratory infection, unspecified: Secondary | ICD-10-CM | POA: Insufficient documentation

## 2016-02-18 NOTE — ED Provider Notes (Addendum)
[facility]-EMERGENCY DEPT Provider Note   CSN: [id number] Arrival date & time: 02/18/16  1756  By signing my name below, I, Clovis PuAvnee Patel, attest that this documentation has been prepared under the direction and in the presence of Pricilla LovelessScott Yehonatan Grandison, MD  Electronically Signed: Clovis PuAvnee Patel, ED Scribe. 02/18/16. 12:07 AM.   History   Chief Complaint Chief Complaint  Patient presents with  . flu like symptoms   The history is provided by the patient. No language interpreter was used.   HPI Comments:  Kerry Salazar is a 41 y.o. female, with a hx of HTN, who presents to the Emergency Department complaining of neck and back pain onset 5:40 AM today. She also reports a headache, a dry cough, rhinorrhea, fever (tmax 100.1) and sick contacts with similar symptoms. Pt denies sore throat, ear pain, abdominal pain, nausea, vomiting, diarrhea, dysuria, weakness, hx of DM, hx of IV drug use, hx of cancer or any other associated symptoms.   Past Medical History:  Diagnosis Date  . Headache(784.0)   . Hypertension     Patient Active Problem List   Diagnosis Date Noted  . Left ovarian cyst 06/14/2014  . Hypertension     Past Surgical History:  Procedure Laterality Date  . CESAREAN SECTION    . OOPHORECTOMY  1998   RSO  . OVARIAN CYST REMOVAL      OB History    Gravida Para Term Preterm AB Living   5 3 3   2 2    SAB TAB Ectopic Multiple Live Births                   Home Medications    Prior to Admission medications   Medication Sig Start Date End Date Taking? Authorizing Provider  acetaminophen (TYLENOL) 325 MG tablet Take 650 mg by mouth every 6 (six) hours as needed for pain.    Historical Provider, MD  Aspirin-Salicylamide-Caffeine (BC HEADACHE POWDER PO) Take 1 each by mouth daily as needed (heacache).    Historical Provider, MD  ibuprofen (ADVIL,MOTRIN) 600 MG tablet Take 1 tablet (600 mg total) by mouth stat. 08/14/13   Wilmer FloorLisa A Leftwich-Kirby, CNM    lisinopril-hydrochlorothiazide (PRINZIDE,ZESTORETIC) 20-12.5 MG per tablet Take 1 tablet by mouth daily. 07/12/14   Harrington ChallengerNancy J Young, NP  medroxyPROGESTERone (DEPO-PROVERA) 150 MG/ML injection Inject 1 mL (150 mg total) into the muscle once. 03/13/15   Harrington ChallengerNancy J Young, NP  medroxyPROGESTERone (PROVERA) 10 MG tablet Take 1 tablet (10 mg total) by mouth daily. 03/13/15   Harrington ChallengerNancy J Young, NP  metroNIDAZOLE (FLAGYL) 500 MG tablet Take 1 tablet (500 mg total) by mouth 2 (two) times daily. 03/13/15   Harrington ChallengerNancy J Young, NP  metroNIDAZOLE (METROGEL VAGINAL) 0.75 % vaginal gel 1 applicator per vagina at HS x 5 03/13/15   Harrington ChallengerNancy J Young, NP    Family History Family History  Problem Relation Age of Onset  . Heart defect Son     Social History Social History  Substance Use Topics  . Smoking status: Never Smoker  . Smokeless tobacco: Never Used  . Alcohol use No     Allergies   Patient has no known allergies.   Review of Systems Review of Systems  Constitutional: Positive for fever.  HENT: Positive for rhinorrhea. Negative for ear pain and sore throat.   Respiratory: Positive for cough.   Gastrointestinal: Negative for abdominal pain, diarrhea, nausea and vomiting.  Genitourinary: Negative for dysuria.  Musculoskeletal: Positive for back pain, myalgias and  neck pain.  Neurological: Positive for headaches. Negative for weakness.  All other systems reviewed and are negative.  Physical Exam Updated Vital Signs BP 138/93 (BP Location: Left Arm)   Pulse 103   Resp 16   SpO2 100%   Physical Exam  Constitutional: She is oriented to person, place, and time. She appears well-developed and well-nourished.  HENT:  Head: Normocephalic and atraumatic.  Right Ear: External ear normal.  Left Ear: External ear normal.  Nose: Nose normal.  Eyes: EOM are normal. Pupils are equal, round, and reactive to light. Right eye exhibits no discharge. Left eye exhibits no discharge.  Neck: Normal range of motion. Neck  supple.  Mild diffuse muscular neck tenderness   Cardiovascular: Normal rate, regular rhythm and normal heart sounds.   Pulmonary/Chest: Effort normal and breath sounds normal. She has no wheezes. She has no rhonchi. She has no rales.  Abdominal: Soft. There is no tenderness.  Musculoskeletal: She exhibits tenderness.  Diffuse spinal and paraspinal thoracic and lumbar tenderness   Neurological: She is alert and oriented to person, place, and time.  Reflex Scores:      Bicep reflexes are 2+ on the right side and 2+ on the left side.      Patellar reflexes are 2+ on the right side and 2+ on the left side. CN 3-12 grossly intact. 5/5 strength in all 4 extremities. Grossly normal sensation. Normal finger to nose.   Skin: Skin is warm and dry.  Nursing note and vitals reviewed.  ED Treatments / Results  DIAGNOSTIC STUDIES:  Oxygen Saturation is 100% on RA, normal by my interpretation.    COORDINATION OF CARE:  12:04 AM Discussed treatment plan with pt at bedside and pt agreed to plan.  Labs (all labs ordered are listed, but only abnormal results are displayed) Labs Reviewed - No data to display  EKG  EKG Interpretation None       Radiology No results found.  Procedures Procedures (including critical care time)  Medications Ordered in ED Medications - No data to display   Initial Impression / Assessment and Plan / ED Course  I have reviewed the triage vital signs and the nursing notes.  Pertinent labs & imaging results that were available during my care of the patient were reviewed by me and considered in my medical decision making (see chart for details).     Patient's symptoms are c/w viral URI, possibly flu. I highly doubt bacterial process such as meningitis or PNA. Highly doubt spinal emergency. Well appearing. Discussed alternating ibuprofen/tylenol, as well as plenty of fluids and other symptomatic care. Discussed strict return precautions.   Final Clinical  Impressions(s) / ED Diagnoses   Final diagnoses:  Upper respiratory tract infection, unspecified type    New Prescriptions New Prescriptions   No medications on file   I personally performed the services described in this documentation, which was scribed in my presence. The recorded information has been reviewed and is accurate.     Pricilla Loveless, MD 02/19/16 0930    Pricilla Loveless, MD 02/19/16 [phone]

## 2016-02-18 NOTE — ED Triage Notes (Addendum)
C/o pain to neck, back, and head with chills, temp 100.1 at home, and non-productive cough since this morning.  Took Ibuprofen pta.

## 2016-02-19 MED ORDER — ACETAMINOPHEN 325 MG PO TABS
650.0000 mg | ORAL_TABLET | Freq: Once | ORAL | Status: AC
  Administered 2016-02-19: 650 mg via ORAL
  Filled 2016-02-19: qty 2

## 2016-02-19 NOTE — Discharge Instructions (Signed)
Return to the ER immediately if you develop worsening or uncontrolled fevers, worse cough, shortness of breath, weakness on one or both sides of your body, inability to hold your bowel or bladder,  or other new or concerning signs/symptoms

## 2016-04-14 ENCOUNTER — Encounter (HOSPITAL_COMMUNITY): Payer: Self-pay | Admitting: *Deleted

## 2016-04-14 ENCOUNTER — Emergency Department (HOSPITAL_COMMUNITY)
Admission: EM | Admit: 2016-04-14 | Discharge: 2016-04-14 | Disposition: A | Discharge status: Home / Self Care | Payer: BC Managed Care – PPO | Attending: Emergency Medicine | Admitting: Emergency Medicine

## 2016-04-14 DIAGNOSIS — Z7982 Long term (current) use of aspirin: Secondary | ICD-10-CM | POA: Insufficient documentation

## 2016-04-14 DIAGNOSIS — I10 Essential (primary) hypertension: Secondary | ICD-10-CM | POA: Insufficient documentation

## 2016-04-14 DIAGNOSIS — B029 Zoster without complications: Secondary | ICD-10-CM | POA: Insufficient documentation

## 2016-04-14 DIAGNOSIS — Z79899 Other long term (current) drug therapy: Secondary | ICD-10-CM | POA: Insufficient documentation

## 2016-04-14 MED ORDER — LIDOCAINE HCL 2 % IJ SOLN
10.0000 mL | Freq: Once | INTRAMUSCULAR | Status: DC
  Filled 2016-04-14: qty 20

## 2016-04-14 MED ORDER — ACYCLOVIR 400 MG PO TABS: 800.0000 mg | ORAL_TABLET | Freq: Every day | ORAL | 0 refills | Status: AC

## 2016-04-14 MED ORDER — TRAMADOL HCL 50 MG PO TABS: 50.0000 mg | ORAL_TABLET | Freq: Four times a day (QID) | ORAL | 0 refills | Status: AC | PRN

## 2016-04-14 NOTE — ED Triage Notes (Signed)
The pt is c/o a boil on her rt inner thigh for 2 days  It started out like a bug bite but has increased in size.  lmp last month

## 2016-04-14 NOTE — ED Provider Notes (Signed)
[facility]PT Provider Note   CSN: [id number] Arrival date & time: 04/14/16  1628   By signing my name below, I, Clovis Pu, attest that this documentation has been prepared under the direction and in the presence of  [employer], PA-C. Electronically Signed: Clovis Pu, ED Scribe. 04/14/16. 6:45 PM.   History   Chief Complaint Chief Complaint  Patient presents with  . Abscess    HPI Comments:  Kerry Salazar is a 41 y.o. female who presents to the Emergency Department complaining of acute onset, gradually worsening area of redness to her right inner thigh x 2 days. She notes associated itching, tingling, and moderate pain. States "when it started it looked like a mosquito bite, just a little red area that was itchy". Her pain is worse upon contact. Pt states her [place] but is unaware if she was bit by a spider or tick. She has used Neosporin with no significant relief. She states she was recently in a hotel room in [place], [place] and states the rash may have developed after that. She denies fevers, chills, headaches, dizziness, numbness/tingling, weakness, chest pain, SOB, abdominal pain, nausea, vomiting, diarrhea or any other associated symptoms. She also denies any recent trauma to the affected area, any known insect bites, known poison ivy contact, new detergent/body lotion use. No other complaints noted.    The history is provided by the patient. No language interpreter was used.    Past Medical History:  Diagnosis Date  . Headache(784.0)   . Hypertension     Patient Active Problem List   Diagnosis Date Noted  . Left ovarian cyst 06/14/2014  . Hypertension     Past Surgical History:  Procedure Laterality Date  . CESAREAN SECTION    . OOPHORECTOMY  1998   RSO  . OVARIAN CYST REMOVAL      OB History    Gravida Para Term Preterm AB Living   SAB TAB Ectopic Multiple Live Births                   Home Medications      Prior to Admission medications   Medication Sig Start Date End Date Taking? Authorizing Provider  acetaminophen (TYLENOL) 325 MG tablet Take 650 mg by mouth every 6 (six) hours as needed for pain.    Historical Provider, MD  acyclovir (ZOVIRAX) 400 MG tablet Take 2 tablets (800 mg total) by mouth 5 (five) times daily. 04/14/16 04/21/16  Alnisa Hasley A Lissy Deuser, PA-C  Aspirin-Salicylamide-Caffeine (BC HEADACHE POWDER PO) Take 1 each by mouth daily as needed (heacache).    Historical Provider, MD  ibuprofen (ADVIL,MOTRIN) 600 MG tablet Take 1 tablet (600 mg total) by mouth stat. 08/14/13   Wilmer Floor Leftwich-Kirby, CNM  lisinopril-hydrochlorothiazide (PRINZIDE,ZESTORETIC) 20-12.5 MG per tablet Take 1 tablet by mouth daily. 07/12/14   Harrington Challenger, NP  medroxyPROGESTERone (DEPO-PROVERA) 150 MG/ML injection Inject 1 mL (150 mg total) into the muscle once. 03/13/15   Harrington Challenger, NP  medroxyPROGESTERone (PROVERA) 10 MG tablet Take 1 tablet (10 mg total) by mouth daily. 03/13/15   Harrington Challenger, NP  metroNIDAZOLE (FLAGYL) 500 MG tablet Take 1 tablet (500 mg total) by mouth 2 (two) times daily. 03/13/15   Harrington Challenger, NP  metroNIDAZOLE (METROGEL VAGINAL) 0.75 % vaginal gel 1 applicator per vagina at HS x 5 03/13/15   Harrington Challenger, NP  traMADol Janean Sark) 50  MG tablet Take 1 tablet (50 mg total) by mouth every 6 (six) hours as needed. 04/14/16 04/15/16  Jeanie Sewer, PA-C    Family History Family History  Problem Relation Age of Onset  . Heart defect Son     Social History Social History  Substance Use Topics  . Smoking status: Never Smoker  . Smokeless tobacco: Never Used  . Alcohol use No     Allergies   Patient has no known allergies.   Review of Systems Review of Systems  Constitutional: Negative for chills and fever.  Eyes: Negative for visual disturbance.  Respiratory: Negative for shortness of breath.   Cardiovascular: Negative for chest pain.  Gastrointestinal: Negative for abdominal pain, blood in  stool, diarrhea, nausea and vomiting.  Genitourinary: Negative for dysuria and hematuria.  Musculoskeletal: Negative for arthralgias and back pain.  Skin: Positive for color change (erythema ) and rash.  Neurological: Negative for dizziness, weakness, numbness and headaches.     Physical Exam Updated Vital Signs BP (!) 134/99   Pulse 84   Temp 98.3 F (36.8 C)   Resp 16   Ht  (1.702 m)   Wt 73.1 kg   LMP 03/14/2016   SpO2 100%   BMI 25.25 kg/m   Physical Exam  Constitutional: She is oriented to person, place, and time. She appears well-developed and well-nourished. No distress.  HENT:  Head: Normocephalic and atraumatic.  Eyes: Conjunctivae and EOM are normal. Right eye exhibits no discharge. Left eye exhibits no discharge. No scleral icterus.  Neck: Normal range of motion. Neck supple. No JVD present. No tracheal deviation present.  Cardiovascular: Normal rate.   Pulmonary/Chest: Effort normal.  Abdominal: She exhibits no distension.  Neurological: She is alert and oriented to person, place, and time.  Skin: Skin is warm and dry. Capillary refill takes less than 2 seconds. Rash noted. There is erythema.  2cm cluster of vesicles on an erythematous base along right medial thigh. Tender to palpation. No drainage, purulence, or bleeding noted. No satellite lesions. No fluctuance appreciated.  Psychiatric: She has a normal mood and affect.  Nursing note and vitals reviewed.    ED Treatments / Results  DIAGNOSTIC STUDIES:  Oxygen Saturation is 100% on RA, normal by my interpretation.    COORDINATION OF CARE:  6:41 PM Discussed treatment plan with pt at bedside and pt agreed to plan.  Labs (all labs ordered are listed, but only abnormal results are displayed) Labs Reviewed - No data to display  EKG  EKG Interpretation None       Radiology No results found.  Procedures Procedures (including critical care time)  Medications Ordered in ED Medications    lidocaine (XYLOCAINE) 2 % (with pres) injection 200 mg (not administered)     Initial Impression / Assessment and Plan / ED Course  I have reviewed the triage vital signs and the nursing notes.  Pertinent labs & imaging results that were available during my care of the patient were reviewed by me and considered in my medical decision making (see chart for details).     40yof presents with 2 day history of worsening rash to right medial thigh, tender to palpation. Pt afebrile, VSS, no constitutional symptoms. On exam pt has an eruption of vesicles on an erythematous base. No surrounding lesions. Pt states she has had chicken pox before. Suspect early manifestation of herpes zoster. No signs of secondary infection. No signs of disseminated herpes.Will rx with acyclovir as cost is  a factor to pt. Discussed use 5x per day for 10 days. Will also provide small course of tramadol prn pain. Recommend f/u with PCP for re-evaluation of symptoms in 1 week. Discussed strict ED return precautions. Pt verbalized understanding of and agreement with plan, and is stable for discharge home at this time.      Final Clinical Impressions(s) / ED Diagnoses   Final diagnoses:  Herpes zoster without complication    New Prescriptions Discharge Medication List as of 04/14/2016  6:57 PM    START taking these medications   Details  acyclovir (ZOVIRAX) 400 MG tablet Take 2 tablets (800 mg total) by mouth 5 (five) times daily., Starting Wed 04/14/2016, Until Wed 04/21/2016, Print    traMADol (ULTRAM) 50 MG tablet Take 1 tablet (50 mg total) by mouth every 6 (six) hours as needed., Starting Wed 04/14/2016, Until Thu 04/15/2016, Print      I personally performed the services described in this documentation, which was scribed in my presence. The recorded information has been reviewed and is accurate.     Jeanie Sewer, PA-C 04/14/16 2028    Nira Conn, MD 04/14/16 2120

## 2016-09-04 ENCOUNTER — Encounter (HOSPITAL_COMMUNITY): Payer: Self-pay | Admitting: Emergency Medicine

## 2016-09-04 ENCOUNTER — Emergency Department (HOSPITAL_COMMUNITY)
Admission: EM | Admit: 2016-09-04 | Discharge: 2016-09-04 | Disposition: A | Discharge status: Home / Self Care | Payer: BC Managed Care – PPO | Attending: Emergency Medicine | Admitting: Emergency Medicine

## 2016-09-04 DIAGNOSIS — B349 Viral infection, unspecified: Secondary | ICD-10-CM | POA: Diagnosis not present

## 2016-09-04 DIAGNOSIS — Z79899 Other long term (current) drug therapy: Secondary | ICD-10-CM | POA: Insufficient documentation

## 2016-09-04 DIAGNOSIS — I10 Essential (primary) hypertension: Secondary | ICD-10-CM | POA: Insufficient documentation

## 2016-09-04 DIAGNOSIS — M791 Myalgia: Secondary | ICD-10-CM | POA: Diagnosis not present

## 2016-09-04 LAB — BASIC METABOLIC PANEL
ANION GAP: 9 (ref 5–15)
BUN: 5 mg/dL — ABNORMAL LOW (ref 6–20)
CALCIUM: 8.9 mg/dL (ref 8.9–10.3)
CO2: 25 mmol/L (ref 22–32)
Chloride: 102 mmol/L (ref 101–111)
Creatinine, Ser: 0.94 mg/dL (ref 0.44–1.00)
GLUCOSE: 105 mg/dL — AB (ref 65–99)
Potassium: 4.7 mmol/L (ref 3.5–5.1)
SODIUM: 136 mmol/L (ref 135–145)

## 2016-09-04 LAB — CBC
HCT: 37.7 % (ref 36.0–46.0)
Hemoglobin: 12.4 g/dL (ref 12.0–15.0)
MCH: 27.4 pg (ref 26.0–34.0)
MCHC: 32.9 g/dL (ref 30.0–36.0)
MCV: 83.4 fL (ref 78.0–100.0)
PLATELETS: 325 10*3/uL (ref 150–400)
RBC: 4.52 MIL/uL (ref 3.87–5.11)
RDW: 15 % (ref 11.5–15.5)
WBC: 7.4 10*3/uL (ref 4.0–10.5)

## 2016-09-04 MED ORDER — ACETAMINOPHEN 325 MG PO TABS
ORAL_TABLET | ORAL | Status: AC
  Filled 2016-09-04: qty 2

## 2016-09-04 MED ORDER — ACETAMINOPHEN 325 MG PO TABS
650.0000 mg | ORAL_TABLET | Freq: Once | ORAL | Status: AC | PRN
  Administered 2016-09-04: 650 mg via ORAL

## 2016-09-04 NOTE — ED Triage Notes (Signed)
Reports generalized aching all over.  Started two days ago.  Also reports fever of 101.1 at home.

## 2016-09-04 NOTE — ED Notes (Signed)
Pt left AMA // sts " i've been here since one something and i'm tired. i'm ready to go." // This NT informed pt that [place], [place] wanted to run one more blood test. Pt responds " I don't care, i'm tired i've been here since one something, which way can I leave?" // this NT showed pt to the waiting room. Joni Reining, PA made aware.

## 2016-09-04 NOTE — ED Provider Notes (Signed)
[facility] Provider Note   CSN: [id number] Arrival date & time: 09/04/16  0144     History   Chief Complaint Chief Complaint  Patient presents with  . Generalized Body Aches     HPI   Blood pressure 116/84, pulse 91, temperature 99.7 F (37.6 C), temperature source Oral, resp. rate 16, height 5\' 7"  (1.702 m), weight 63.5 kg (140 lb), SpO2 100 %.  Kerry Salazar is a 41 y.o. female complaining of diffuse myalgia onset 2 days ago associated with a MAXIMUM TEMPERATURE of 101.1 yesterday. She states that the pain is worse in the low back and neck area but there is no neck stiffness or headache or confusion. 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 She denies rash, tick bite, rhinorrhea, cough, chest pain, shortness of breath, abdominal pain, nausea vomiting, change in bowel or bladder habits. She has been taking Goody powder at home with some relief.   Past Medical History:  Diagnosis Date  . Headache(784.0)   . Hypertension     Patient Active Problem List   Diagnosis Date Noted  . Left ovarian cyst 06/14/2014  . Hypertension     Past Surgical History:  Procedure Laterality Date  . CESAREAN SECTION    . OOPHORECTOMY  1998   RSO  . OVARIAN CYST REMOVAL      OB History    Gravida Para Term Preterm AB Living   5 3 3   2 2    SAB TAB Ectopic Multiple Live Births                   Home Medications    Prior to Admission medications   Medication Sig Start Date End Date Taking? Authorizing Provider  acetaminophen (TYLENOL) 325 MG tablet Take 650 mg by mouth every 6 (six) hours as needed for pain.    [provider]  Aspirin-Salicylamide-Caffeine (BC HEADACHE POWDER PO) Take 1 each by mouth daily as needed (heacache).    [provider]  ibuprofen (ADVIL,MOTRIN) 600 MG tablet Take 1 tablet (600 mg total) by mouth stat. 08/14/13   Leftwich-Kirby, Wilmer Floor, CNM  lisinopril-hydrochlorothiazide (PRINZIDE,ZESTORETIC) 20-12.5 MG  per tablet Take 1 tablet by mouth daily. 07/12/14   Harrington Challenger, NP  medroxyPROGESTERone (DEPO-PROVERA) 150 MG/ML injection Inject 1 mL (150 mg total) into the muscle once. 03/13/15   Harrington Challenger, NP  medroxyPROGESTERone (PROVERA) 10 MG tablet Take 1 tablet (10 mg total) by mouth daily. 03/13/15   Harrington Challenger, NP  metroNIDAZOLE (FLAGYL) 500 MG tablet Take 1 tablet (500 mg total) by mouth 2 (two) times daily. 03/13/15   Harrington Challenger, NP  metroNIDAZOLE (METROGEL VAGINAL) 0.75 % vaginal gel 1 applicator per vagina at HS x 5 03/13/15   Harrington Challenger, NP    Family History Family History  Problem Relation Age of Onset  . Heart defect Son     Social History Social History  Substance Use Topics  . Smoking status: Never Smoker  . Smokeless tobacco: Never Used  . Alcohol use No     Allergies   Patient has no known allergies.   Review of Systems Review of Systems  A complete review of systems was obtained and all systems are negative except as noted in the HPI and PMH.    Physical Exam Updated Vital Signs BP 116/84   Pulse 91   Temp 99.7 F (37.6 C) (Oral)   Resp 16   Ht 5'  7" (1.702 m)   Wt 63.5 kg (140 lb)   SpO2 100%   BMI 21.93 kg/m   Physical Exam  Constitutional: She appears well-developed and well-nourished.  HENT:  Head: Normocephalic.  Right Ear: External ear normal.  Left Ear: External ear normal.  Mouth/Throat: Oropharynx is clear and moist. No oropharyngeal exudate.  No drooling or stridor. Posterior pharynx mildly erythematous no significant tonsillar hypertrophy. No exudate. Soft palate rises symmetrically. No TTP or induration under tongue.   No tenderness to palpation of frontal or bilateral maxillary sinuses.  Mild mucosal edema in the nares with scant rhinorrhea.  Bilateral tympanic membranes with normal architecture and good light reflex.    Eyes: Pupils are equal, round, and reactive to light. Conjunctivae and EOM are normal.  Neck: Normal  range of motion. Neck supple.  FROM to C-spine. Pt can touch chin to chest without discomfort. No TTP of midline cervical spine.   Cardiovascular: Normal rate and regular rhythm.   Pulmonary/Chest: Effort normal and breath sounds normal. No stridor. No respiratory distress. She has no wheezes. She has no rales. She exhibits no tenderness.  Abdominal: Soft. There is no tenderness. There is no rebound and no guarding.  Nursing note and vitals reviewed.    ED Treatments / Results  Labs (all labs ordered are listed, but only abnormal results are displayed) Labs Reviewed  BASIC METABOLIC PANEL - Abnormal; Notable for the following:       Result Value   Glucose, Bld 105 (*)    BUN 5 (*)    All other components within normal limits  CBC  MAGNESIUM  URINALYSIS, ROUTINE W REFLEX MICROSCOPIC  I-STAT CG4 LACTIC ACID, ED    EKG  EKG Interpretation None       Radiology No results found.  Procedures Procedures (including critical care time)  Medications Ordered in ED Medications  acetaminophen (TYLENOL) tablet 650 mg (650 mg Oral Given 09/04/16 0200)     Initial Impression / Assessment and Plan / ED Course  I have reviewed the triage vital signs and the nursing notes.  Pertinent labs & imaging results that were available during my care of the patient were reviewed by me and considered in my medical decision making (see chart for details).     Vitals:   09/04/16 0149 09/04/16 0155 09/04/16 0413 09/04/16 0810  BP: (!) 151/98  (!) 142/91 116/84  Pulse: (!) 105  (!) 107 91  Resp: 16  20 16   Temp: 99.6 F (37.6 C)  99.7 F (37.6 C)   TempSrc: Oral  Oral   SpO2: 98%  99% 100%  Weight:  63.5 kg (140 lb)    Height:  5\' 7"  (1.702 m)      Medications  acetaminophen (TYLENOL) tablet 650 mg (650 mg Oral Given 09/04/16 0200)    Kerry Salazar is 41 y.o. female presenting with myalgia onset 2 days ago with associated fever of 101 at home. Patient is nontoxic appearing,  no meningeal signs. 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 Vital signs reassuring, initially this patient was tachycardic however this is resolved by my exam. I would like to check a urinalysis, lactic acid.  It appears this patient eloped before blood tests could be drawn. I think she had an extended wait time and stated she needed to go home.     Final Clinical Impressions(s) / ED Diagnoses   Final diagnoses:  Viral syndrome    New Prescriptions  Discharge Medication List as of 09/04/2016  9:35 AM       Christyna Letendre, Mardella Layman 09/04/16 1009    Lorre Nick, MD 09/04/16 1315

## 2017-04-27 ENCOUNTER — Ambulatory Visit: Payer: BC Managed Care – PPO | Admitting: Women's Health

## 2017-06-06 ENCOUNTER — Emergency Department (HOSPITAL_BASED_OUTPATIENT_CLINIC_OR_DEPARTMENT_OTHER)
Admission: EM | Admit: 2017-06-06 | Discharge: 2017-06-06 | Disposition: A | Discharge status: Home / Self Care | Payer: BLUE CROSS/BLUE SHIELD | Attending: Emergency Medicine | Admitting: Emergency Medicine

## 2017-06-06 ENCOUNTER — Encounter (HOSPITAL_BASED_OUTPATIENT_CLINIC_OR_DEPARTMENT_OTHER): Payer: Self-pay

## 2017-06-06 ENCOUNTER — Other Ambulatory Visit: Payer: Self-pay

## 2017-06-06 DIAGNOSIS — L02412 Cutaneous abscess of left axilla: Secondary | ICD-10-CM | POA: Diagnosis not present

## 2017-06-06 DIAGNOSIS — I10 Essential (primary) hypertension: Secondary | ICD-10-CM | POA: Diagnosis not present

## 2017-06-06 DIAGNOSIS — Z79899 Other long term (current) drug therapy: Secondary | ICD-10-CM | POA: Diagnosis not present

## 2017-06-06 MED ORDER — LIDOCAINE-EPINEPHRINE 2 %-1:100000 IJ SOLN: 20.0000 mL | Freq: Once | INTRAMUSCULAR | Status: DC

## 2017-06-06 MED ORDER — DOXYCYCLINE HYCLATE 100 MG PO CAPS: 100.0000 mg | ORAL_CAPSULE | Freq: Two times a day (BID) | ORAL | 0 refills | Status: DC

## 2017-06-06 MED ORDER — LIDOCAINE-EPINEPHRINE (PF) 2 %-1:200000 IJ SOLN
INTRAMUSCULAR | Status: AC
  Filled 2017-06-06: qty 10

## 2017-06-06 MED ORDER — LIDOCAINE-EPINEPHRINE (PF) 2 %-1:200000 IJ SOLN
10.0000 mL | Freq: Once | INTRAMUSCULAR | Status: AC
  Administered 2017-06-06: 10 mL via INTRADERMAL
  Filled 2017-06-06: qty 10

## 2017-06-06 MED ORDER — DOXYCYCLINE HYCLATE 100 MG PO TABS
100.0000 mg | ORAL_TABLET | Freq: Once | ORAL | Status: AC
  Administered 2017-06-06: 100 mg via ORAL
  Filled 2017-06-06: qty 1

## 2017-06-06 MED ORDER — HYDROCODONE-ACETAMINOPHEN 5-325 MG PO TABS: 1.0000 | ORAL_TABLET | ORAL | 0 refills | Status: DC | PRN

## 2017-06-06 NOTE — ED Triage Notes (Signed)
C/o left axilla boil x 2.5 weeks-NAD-steady gait

## 2017-06-06 NOTE — ED Provider Notes (Signed)
[facility] Provider Note: Lowella Dell, MD, FACEP  CSN: [id number] MRN: [id number] ARRIVAL: 06/06/17 at 2052 ROOM: MH02/[facility]02   CHIEF COMPLAINT  Abscess   HISTORY OF PRESENT ILLNESS  06/06/17 10:57 PM Kerry Salazar is a 42 y.o. female with a tender, swollen area of her left axilla that is been present for 2-1/2 weeks.  She is yet been using warm compresses and attempt to drop to ahead but it has not drained.  She denies fever or chills with it.  It is associated with moderate to severe pain, worse with palpation or movement.   Past Medical History:  Diagnosis Date  . Headache(784.0)   . Hypertension     Past Surgical History:  Procedure Laterality Date  . CESAREAN SECTION    . OOPHORECTOMY  1998   RSO  . OVARIAN CYST REMOVAL      Family History  Problem Relation Age of Onset  . Heart defect Son     Social History   Tobacco Use  . Smoking status: Never Smoker  . Smokeless tobacco: Never Used  Substance Use Topics  . Alcohol use: No    Alcohol/week: 0.0 oz  . Drug use: No    Prior to Admission medications   Medication Sig Start Date End Date Taking? Authorizing Provider  acetaminophen (TYLENOL) 325 MG tablet Take 650 mg by mouth every 6 (six) hours as needed for pain.    [provider]  Aspirin-Salicylamide-Caffeine (BC HEADACHE POWDER PO) Take 1 each by mouth daily as needed (heacache).    [provider]  ibuprofen (ADVIL,MOTRIN) 600 MG tablet Take 1 tablet (600 mg total) by mouth stat. 08/14/13   Leftwich-Kirby, Wilmer Floor, CNM  lisinopril-hydrochlorothiazide (PRINZIDE,ZESTORETIC) 20-12.5 MG per tablet Take 1 tablet by mouth daily. 07/12/14   Harrington Challenger, NP  medroxyPROGESTERone (DEPO-PROVERA) 150 MG/ML injection Inject 1 mL (150 mg total) into the muscle once. 03/13/15   Harrington Challenger, NP  medroxyPROGESTERone (PROVERA) 10 MG tablet Take 1 tablet (10 mg total) by mouth daily. 03/13/15   Harrington Challenger, NP  metroNIDAZOLE  (FLAGYL) 500 MG tablet Take 1 tablet (500 mg total) by mouth 2 (two) times daily. 03/13/15   Harrington Challenger, NP  metroNIDAZOLE (METROGEL VAGINAL) 0.75 % vaginal gel 1 applicator per vagina at HS x 5 03/13/15   Young, Davonna Belling, NP    Allergies Patient has no known allergies.   REVIEW OF SYSTEMS  Negative except as noted here or in the History of Present Illness.   PHYSICAL EXAMINATION  Initial Vital Signs Blood pressure (!) 151/90, pulse 100, temperature 98.2 F (36.8 C), temperature source Oral, resp. rate 18, height  (1.803 m), weight 119.5 kg (263 lb 7.2 oz), last menstrual period 06/06/2017, SpO2 97 %.  Examination General: Well-developed, well-nourished female in no acute distress; appearance consistent with age of record HENT: normocephalic; atraumatic Eyes: Normal appearance Neck: supple Heart: regular rate and rhythm Lungs: clear to auscultation bilaterally Abdomen: soft; nondistended; nontender; bowel sounds present Extremities: No deformity; full range of motion; pulses normal Neurologic: Awake, alert and oriented; motor function intact in all extremities and symmetric; no facial droop Skin: Warm and dry; large abscess of axilla Psychiatric: Normal mood and affect   RESULTS  Summary of this visit's results, reviewed by myself:   EKG Interpretation  Date/Time:    Ventricular Rate:    PR Interval:    QRS Duration:   QT Interval:    QTC Calculation:  R Axis:     Text Interpretation:        Laboratory Studies: No results found for this or any previous visit (from the past 24 hour(s)). Imaging Studies: No results found.  ED COURSE and MDM  Nursing notes and initial vitals signs, including pulse oximetry, reviewed.  Vitals:   06/06/17 2058  BP: (!) 151/90  Pulse: 100  Resp: 18  Temp: 98.2 F (36.8 C)  TempSrc: Oral  SpO2: 97%  Weight: 119.5 kg (263 lb 7.2 oz)  Height:  (1.803 m)   Given size and prolonged course of abscess we will place  her on doxycycline for 1 week.  She will be advised to return in 3 days for recheck and packing removal.  Consultation with the [facility] state controlled substances database reveals the patient has received 1 prescription for tramadol in the last 2 years.  PROCEDURES   INCISION AND DRAINAGE Performed by: Paula Libra L Consent: Verbal consent obtained. Risks and benefits: risks, benefits and alternatives were discussed Type: abscess  Body area: Left axilla  Anesthesia: local infiltration  Incision was made with a scalpel.  Local anesthetic: lidocaine 2 % with epinephrine  Anesthetic total: 6 ml  Complexity: complex Blunt dissection to break up loculations  Drainage: purulent  Drainage amount: Copious  Packing material: 1/2 in iodoform gauze  Patient tolerance: Patient tolerated the procedure well with no immediate complications.     ED DIAGNOSES     ICD-10-CM   1. Abscess of axilla, left L02.412        Paula Libra, MD 06/06/17 2322

## 2017-06-06 NOTE — ED Notes (Signed)
ED Provider at bedside. 

## 2017-06-09 ENCOUNTER — Encounter (HOSPITAL_BASED_OUTPATIENT_CLINIC_OR_DEPARTMENT_OTHER): Payer: Self-pay | Admitting: *Deleted

## 2017-06-09 ENCOUNTER — Other Ambulatory Visit: Payer: Self-pay

## 2017-06-09 ENCOUNTER — Emergency Department (HOSPITAL_BASED_OUTPATIENT_CLINIC_OR_DEPARTMENT_OTHER)
Admission: EM | Admit: 2017-06-09 | Discharge: 2017-06-09 | Disposition: A | Discharge status: Home / Self Care | Payer: BLUE CROSS/BLUE SHIELD | Attending: Emergency Medicine | Admitting: Emergency Medicine

## 2017-06-09 DIAGNOSIS — I10 Essential (primary) hypertension: Secondary | ICD-10-CM | POA: Insufficient documentation

## 2017-06-09 DIAGNOSIS — Z4800 Encounter for change or removal of nonsurgical wound dressing: Secondary | ICD-10-CM | POA: Diagnosis not present

## 2017-06-09 DIAGNOSIS — L02412 Cutaneous abscess of left axilla: Secondary | ICD-10-CM | POA: Insufficient documentation

## 2017-06-09 MED ORDER — LIDOCAINE-EPINEPHRINE 2 %-1:100000 IJ SOLN: 20.0000 mL | Freq: Once | INTRAMUSCULAR | Status: DC

## 2017-06-09 NOTE — ED Provider Notes (Signed)
[facility] EMERGENCY DEPARTMENT Provider Note   CSN: [id number] Arrival date & time: 06/09/17  0902     History   Chief Complaint Chief Complaint  Patient presents with  . Abscess    HPI Kerry Salazar is a 42 y.o. female.  HPI Kerry Salazar is a 42 y.o. female with history of hydradenitis, hypertension, presents to emergency department for recheck of the left axillary abscess.  States she had a large abscess that was drained 3 days ago here in emergency department.  Abscess was packed and she was told to come back.  She is currently taking antibiotics.  She states pain is much improved, however abscess is still draining.  Packing came out itself yesterday.  No fever or chills.  No generalized malaise.  No nausea or vomiting.  This is a recurrent issue for the patient.  Past Medical History:  Diagnosis Date  . Headache(784.0)   . Hypertension     Patient Active Problem List   Diagnosis Date Noted  . Left ovarian cyst 06/14/2014  . Hypertension     Past Surgical History:  Procedure Laterality Date  . CESAREAN SECTION    . OOPHORECTOMY  1998   RSO  . OVARIAN CYST REMOVAL       OB History    Gravida  5   Para  3   Term  3   Preterm      AB  2   Living  2     SAB      TAB      Ectopic      Multiple      Live Births               Home Medications    Prior to Admission medications   Medication Sig Start Date End Date Taking? Authorizing Provider  doxycycline (VIBRAMYCIN) 100 MG capsule Take 1 capsule (100 mg total) by mouth 2 (two) times daily. One po bid x 7 days 06/06/17   Molpus, John, MD  HYDROcodone-acetaminophen (NORCO) 5-325 MG tablet Take 1 tablet by mouth every 4 (four) hours as needed (for pain). 06/06/17   Molpus, John, MD    Family History Family History  Problem Relation Age of Onset  . Heart defect Son     Social History Social History   Tobacco Use  . Smoking status: Never Smoker  . Smokeless  tobacco: Never Used  Substance Use Topics  . Alcohol use: No    Alcohol/week: 0.0 oz  . Drug use: No     Allergies   Patient has no known allergies.   Review of Systems Review of Systems  Constitutional: Negative for chills and fever.  Gastrointestinal: Negative for nausea and vomiting.  Musculoskeletal: Negative for myalgias.  Skin: Positive for wound.  Neurological: Negative for headaches.     Physical Exam Updated Vital Signs BP 134/82 (BP Location: Right Arm)   Pulse 80   Temp 97.9 F (36.6 C) (Oral)   Resp 18   LMP 06/06/2017   SpO2 99%   Physical Exam  Constitutional: She appears well-developed and well-nourished. No distress.  Eyes: Conjunctivae are normal.  Neck: Neck supple.  Neurological: She is alert.  Skin: Skin is warm and dry.  Scar tissue, diffuse induration to the left axilla.  There is an incision from a recent I&D which is mostly closed with a small part of it still open draining purulent/bloody drainage.  No erythema to the axilla, however diffuse  tenderness and fullness present.  Nursing note and vitals reviewed.    ED Treatments / Results  Labs (all labs ordered are listed, but only abnormal results are displayed) Labs Reviewed - No data to display  EKG None  Radiology No results found.  Procedures .Marland KitchenIncision and Drainage Date/Time: 06/09/2017 10:47 AM Performed by: Jaynie Crumble, PA-C Authorized by: Jaynie Crumble, PA-C   Consent:    Consent obtained:  Verbal   Consent given by:  Patient   Risks discussed:  Bleeding, incomplete drainage, pain and infection   Alternatives discussed:  No treatment and observation Location:    Type:  Abscess   Location:  Upper extremity   Upper extremity location:  Arm   Arm location:  L upper arm Pre-procedure details:    Skin preparation:  Betadine Anesthesia (see MAR for exact dosages):    Anesthesia method:  Local infiltration   Local anesthetic:  Lidocaine 2% WITH  epi Procedure type:    Complexity:  Simple Procedure details:    Wound management:  Probed and deloculated, irrigated with saline and debrided   Drainage:  Bloody and purulent   Packing materials:  1/4 in gauze Post-procedure details:    Patient tolerance of procedure:  Tolerated well, no immediate complications Comments:     Re opened healed incision and drained, debrided, packed   (including critical care time)  Medications Ordered in ED Medications  lidocaine-EPINEPHrine (XYLOCAINE W/EPI) 2 %-1:100000 (with pres) injection 20 mL (has no administration in time range)     Initial Impression / Assessment and Plan / ED Course  I have reviewed the triage vital signs and the nursing notes.  Pertinent labs & imaging results that were available during my care of the patient were reviewed by me and considered in my medical decision making (see chart for details).     Patient with healing abscess, still some purulent drainage, incision mostly closed, I reopened the incision impacted again after debriding and irrigating the wound.  She is afebrile, nontoxic-appearing.  No significant cellulitic changes around the abscess.  Continue antibiotics, continue wound care, follow-up as needed in 2 days.  I instructed her that she can pull the packing out in 2 days and if the abscess appears to be healing and better, she will just need to follow-up as needed.  Patient agreed  Vitals:   06/09/17 0911  BP: 134/82  Pulse: 80  Resp: 18  Temp: 97.9 F (36.6 C)  TempSrc: Oral  SpO2: 99%     Final Clinical Impressions(s) / ED Diagnoses   Final diagnoses:  Abscess of left axilla    ED Discharge Orders    None       Jaynie Crumble, PA-C 06/09/17 1051    Little, Ambrose Finland, MD 06/09/17 [phone]

## 2017-06-09 NOTE — Discharge Instructions (Addendum)
Continue heating pads, continue antibiotics.  In 2 days pull the packing.  If continues to heal well, follow-up as needed.  If not healing well, if it is worsening, if unable to remove the packing, return to emergency department for recheck

## 2017-06-09 NOTE — ED Notes (Signed)
I&D of left axillary abscess done by PA, tol well, packing intact and 4x4's applied to cover wound

## 2017-06-09 NOTE — ED Triage Notes (Signed)
Pt reports abcess to left axilla x 2 weeks, I&D here on Monday, packing fell out yesterday. Pt states she feels much better.

## 2017-08-01 ENCOUNTER — Telehealth: Payer: Self-pay | Admitting: *Deleted

## 2017-08-01 NOTE — Telephone Encounter (Signed)
Per Documentation from health Dept last Injection 05/13/17 Called pt to scheduled next injection. VM

## 2017-08-03 NOTE — Telephone Encounter (Signed)
Appt 08/04/17 at 8:30 Depo Provera

## 2017-08-04 ENCOUNTER — Ambulatory Visit (INDEPENDENT_AMBULATORY_CARE_PROVIDER_SITE_OTHER): Payer: BLUE CROSS/BLUE SHIELD | Admitting: Anesthesiology

## 2017-08-04 DIAGNOSIS — Z3042 Encounter for surveillance of injectable contraceptive: Secondary | ICD-10-CM

## 2017-08-04 MED ORDER — MEDROXYPROGESTERONE ACETATE 150 MG/ML IM SUSP
150.0000 mg | Freq: Once | INTRAMUSCULAR | Status: AC
  Administered 2017-08-04: 150 mg via INTRAMUSCULAR

## 2017-09-02 DIAGNOSIS — N912 Amenorrhea, unspecified: Secondary | ICD-10-CM | POA: Diagnosis not present

## 2017-09-02 DIAGNOSIS — N898 Other specified noninflammatory disorders of vagina: Secondary | ICD-10-CM | POA: Diagnosis not present

## 2017-09-02 DIAGNOSIS — B373 Candidiasis of vulva and vagina: Secondary | ICD-10-CM | POA: Diagnosis not present

## 2017-10-24 ENCOUNTER — Telehealth: Payer: Self-pay | Admitting: *Deleted

## 2017-10-24 NOTE — Telephone Encounter (Signed)
Yes okay for Depo,  have her schedule annual exam.  She has been a patient here for years.

## 2017-10-24 NOTE — Telephone Encounter (Signed)
Patient called stating she has depo injection for tomorrow and wanted to make sure we have medication here for her, I explained we send Rx to pharmacy to pick up, we typically don't keep depo provera in office to give every 3 months to patient's, she was last seen in our office for annual exam in July 2017, I asked her about this and she told me very rude she was seen at the health department. But I only see notes scanned in for depo injections at health department, however she has received depo injection July 2019.   I will see if appointment desk can schedule annual exam, are you okay with Rx for depo to pharmacy?

## 2017-10-25 ENCOUNTER — Ambulatory Visit: Payer: BLUE CROSS/BLUE SHIELD

## 2017-10-25 MED ORDER — MEDROXYPROGESTERONE ACETATE 150 MG/ML IM SUSP: 150.0000 mg | Freq: Once | INTRAMUSCULAR | 0 refills | Status: DC

## 2017-10-25 NOTE — Telephone Encounter (Signed)
Spoke with patient Rx sent, she will come tomorrow at 8:20am for depo, she will also schedule annual exam.

## 2017-10-26 ENCOUNTER — Ambulatory Visit (INDEPENDENT_AMBULATORY_CARE_PROVIDER_SITE_OTHER): Payer: BLUE CROSS/BLUE SHIELD | Admitting: *Deleted

## 2017-10-26 DIAGNOSIS — Z3042 Encounter for surveillance of injectable contraceptive: Secondary | ICD-10-CM | POA: Diagnosis not present

## 2017-10-26 MED ORDER — MEDROXYPROGESTERONE ACETATE 150 MG/ML IM SUSP
150.0000 mg | Freq: Once | INTRAMUSCULAR | Status: AC
  Administered 2017-10-26: 150 mg via INTRAMUSCULAR

## 2017-11-29 ENCOUNTER — Encounter: Payer: BLUE CROSS/BLUE SHIELD | Admitting: Women's Health

## 2017-12-07 ENCOUNTER — Ambulatory Visit (INDEPENDENT_AMBULATORY_CARE_PROVIDER_SITE_OTHER): Payer: BLUE CROSS/BLUE SHIELD | Admitting: Women's Health

## 2017-12-07 ENCOUNTER — Encounter: Payer: Self-pay | Admitting: Women's Health

## 2017-12-07 VITALS — BP 126/80 | Ht 68.0 in | Wt 149.0 lb

## 2017-12-07 DIAGNOSIS — Z113 Encounter for screening for infections with a predominantly sexual mode of transmission: Secondary | ICD-10-CM

## 2017-12-07 DIAGNOSIS — Z01419 Encounter for gynecological examination (general) (routine) without abnormal findings: Secondary | ICD-10-CM | POA: Diagnosis not present

## 2017-12-07 MED ORDER — MEDROXYPROGESTERONE ACETATE 150 MG/ML IM SUSP: 150.0000 mg | Freq: Once | INTRAMUSCULAR | 4 refills | Status: DC

## 2017-12-07 NOTE — Addendum Note (Signed)
Addended by: Tito DineBONHAM, KIM A on: 12/07/2017 02:12 PM   Modules accepted: Orders

## 2017-12-07 NOTE — Progress Notes (Signed)
Kerry DockBelinda J Salazar 1975/02/08 [id number]    History:    Presents for annual exam.  Amenorrheic on Depo-Provera.  Normal Pap history.  Has not had a screening mammogram.  Hypertension in the past currently on no medication and blood pressure stable.  Normal health screening labs through work.  Past medical history, past surgical history, family history and social history were all reviewed and documented in the EPIC  2 living sons, had a son that died at 6 months from heart defect 13 years ago.  Youngest son 3314,  playing basketball and doing well in school.  Oldest son in his 4920s has been in jail several times and making poor choices.  ROS:  A ROS was performed and pertinent positives and negatives are included.  Exam:  Vitals:   12/07/17 1221  BP: 126/80  Weight: 149 lb (67.6 kg)  Height: 5\' 8"  (1.727 m)   Body mass index is 22.66 kg/m.   General appearance:  Normal Thyroid:  Symmetrical, normal in size, without palpable masses or nodularity. Respiratory  Auscultation:  Clear without wheezing or rhonchi Cardiovascular  Auscultation:  Regular rate, without rubs, murmurs or gallops  Edema/varicosities:  Not grossly evident Abdominal  Soft,nontender, without masses, guarding or rebound.  Liver/spleen:  No organomegaly noted  Hernia:  None appreciated  Skin  Inspection:  Grossly normal   Breasts: Examined lying and sitting.     Right: Without masses, retractions, discharge or axillary adenopathy.     Left: Without masses, retractions, discharge or axillary adenopathy. Gentitourinary   Inguinal/mons:  Normal without inguinal adenopathy  External genitalia:  Normal  BUS/Urethra/Skene's glands:  Normal  Vagina:  Normal  Cervix:  Normal  Uterus:   normal in size, shape and contour.  Midline and mobile  Adnexa/parametria:     Rt: Without masses or tenderness.   Lt: Without masses or tenderness.  Anus and perineum: Normal  Digital rectal exam: Normal sphincter tone without  palpated masses or tenderness  Assessment/Plan:  42 y.o. SBF G5P3 1 deceased for annual exam with no complaints.  Amenorrheic on Depo-Provera STD screen  Plan: Depo-Provera 150 mg q. 12 weeks prescription, proper use given and reviewed.  Declined blood work, GC/chlamydia culture taken, Pap with HR HPV typing.  SBE's, reviewed importance of annual screening mammogram has not had a screening, breast center information given instructed to schedule.  Exercise, calcium rich foods, low-sodium diet encouraged.  Vitamin D 1000 daily.  Condoms until permanent partner.   Harrington Challengerancy J Alia Salazar [facility] Surgery Center LLCWHNP, 12:33 PM 12/07/2017

## 2017-12-07 NOTE — Patient Instructions (Addendum)
[facility]  [phone]  Health Maintenance, Female Adopting a healthy lifestyle and getting preventive care can go a long way to promote health and wellness. Talk with your health care provider about what schedule of regular examinations is right for you. This is a good chance for you to check in with your provider about disease prevention and staying healthy. In between checkups, there are plenty of things you can do on your own. Experts have done a lot of research about which lifestyle changes and preventive measures are most likely to keep you healthy. Ask your health care provider for more information. Weight and diet Eat a healthy diet  Be sure to include plenty of vegetables, fruits, low-fat dairy products, and lean protein.  Do not eat a lot of foods high in solid fats, added sugars, or salt.  Get regular exercise. This is one of the most important things you can do for your health. ? Most adults should exercise for at least 150 minutes each week. The exercise should increase your heart rate and make you sweat (moderate-intensity exercise). ? Most adults should also do strengthening exercises at least twice a week. This is in addition to the moderate-intensity exercise.  Maintain a healthy weight  Body mass index (BMI) is a measurement that can be used to identify possible weight problems. It estimates body fat based on height and weight. Your health care provider can help determine your BMI and help you achieve or maintain a healthy weight.  For females 20 years of age and older: ? A BMI below 18.5 is considered underweight. ? A BMI of 18.5 to 24.9 is normal. ? A BMI of 25 to 29.9 is considered overweight. ? A BMI of 30 and above is considered obese.  Watch levels of cholesterol and blood lipids  You should start having your blood tested for lipids and cholesterol at 43 years of age, then have this test every 5 years.  You may need to have your cholesterol levels checked more  often if: ? Your lipid or cholesterol levels are high. ? You are older than 43 years of age. ? You are at high risk for heart disease.  Cancer screening Lung Cancer  Lung cancer screening is recommended for adults 55-80 years old who are at high risk for lung cancer because of a history of smoking.  A yearly low-dose CT scan of the lungs is recommended for people who: ? Currently smoke. ? Have quit within the past 15 years. ? Have at least a 30-pack-year history of smoking. A pack year is smoking an average of one pack of cigarettes a day for 1 year.  Yearly screening should continue until it has been 15 years since you quit.  Yearly screening should stop if you develop a health problem that would prevent you from having lung cancer treatment.  Breast Cancer  Practice breast self-awareness. This means understanding how your breasts normally appear and feel.  It also means doing regular breast self-exams. Let your health care provider know about any changes, no matter how small.  If you are in your 20s or 30s, you should have a clinical breast exam (CBE) by a health care provider every 1-3 years as part of a regular health exam.  If you are 40 or older, have a CBE every year. Also consider having a breast X-ray (mammogram) every year.  If you have a family history of breast cancer, talk to your health care provider about genetic screening.  If   you are at high risk for breast cancer, talk to your health care provider about having an MRI and a mammogram every year.  Breast cancer gene (BRCA) assessment is recommended for women who have family members with BRCA-related cancers. BRCA-related cancers include: ? Breast. ? Ovarian. ? Tubal. ? Peritoneal cancers.  Results of the assessment will determine the need for genetic counseling and BRCA1 and BRCA2 testing.  Cervical Cancer Your health care provider may recommend that you be screened regularly for cancer of the pelvic organs  (ovaries, uterus, and vagina). This screening involves a pelvic examination, including checking for microscopic changes to the surface of your cervix (Pap test). You may be encouraged to have this screening done every 3 years, beginning at age 21.  For women ages 30-65, health care providers may recommend pelvic exams and Pap testing every 3 years, or they may recommend the Pap and pelvic exam, combined with testing for human papilloma virus (HPV), every 5 years. Some types of HPV increase your risk of cervical cancer. Testing for HPV may also be done on women of any age with unclear Pap test results.  Other health care providers may not recommend any screening for nonpregnant women who are considered low risk for pelvic cancer and who do not have symptoms. Ask your health care provider if a screening pelvic exam is right for you.  If you have had past treatment for cervical cancer or a condition that could lead to cancer, you need Pap tests and screening for cancer for at least 20 years after your treatment. If Pap tests have been discontinued, your risk factors (such as having a new sexual partner) need to be reassessed to determine if screening should resume. Some women have medical problems that increase the chance of getting cervical cancer. In these cases, your health care provider may recommend more frequent screening and Pap tests.  Colorectal Cancer  This type of cancer can be detected and often prevented.  Routine colorectal cancer screening usually begins at 43 years of age and continues through 43 years of age.  Your health care provider may recommend screening at an earlier age if you have risk factors for colon cancer.  Your health care provider may also recommend using home test kits to check for hidden blood in the stool.  A small camera at the end of a tube can be used to examine your colon directly (sigmoidoscopy or colonoscopy). This is done to check for the earliest forms of  colorectal cancer.  Routine screening usually begins at age 50.  Direct examination of the colon should be repeated every 5-10 years through 43 years of age. However, you may need to be screened more often if early forms of precancerous polyps or small growths are found.  Skin Cancer  Check your skin from head to toe regularly.  Tell your health care provider about any new moles or changes in moles, especially if there is a change in a mole's shape or color.  Also tell your health care provider if you have a mole that is larger than the size of a pencil eraser.  Always use sunscreen. Apply sunscreen liberally and repeatedly throughout the day.  Protect yourself by wearing long sleeves, pants, a wide-brimmed hat, and sunglasses whenever you are outside.  Heart disease, diabetes, and high blood pressure  High blood pressure causes heart disease and increases the risk of stroke. High blood pressure is more likely to develop in: ? People who have blood pressure   in the high end of the normal range (130-139/85-89 mm Hg). ? People who are overweight or obese. ? People who are African American.  If you are 28-40 years of age, have your blood pressure checked every 3-5 years. If you are 66 years of age or older, have your blood pressure checked every year. You should have your blood pressure measured twice-once when you are at a hospital or clinic, and once when you are not at a hospital or clinic. Record the average of the two measurements. To check your blood pressure when you are not at a hospital or clinic, you can use: ? An automated blood pressure machine at a pharmacy. ? A home blood pressure monitor.  If you are between 22 years and 24 years old, ask your health care provider if you should take aspirin to prevent strokes.  Have regular diabetes screenings. This involves taking a blood sample to check your fasting blood sugar level. ? If you are at a normal weight and have a low risk  for diabetes, have this test once every three years after 43 years of age. ? If you are overweight and have a high risk for diabetes, consider being tested at a younger age or more often. Preventing infection Hepatitis B  If you have a higher risk for hepatitis B, you should be screened for this virus. You are considered at high risk for hepatitis B if: ? You were born in a country where hepatitis B is common. Ask your health care provider which countries are considered high risk. ? Your parents were born in a high-risk country, and you have not been immunized against hepatitis B (hepatitis B vaccine). ? You have HIV or AIDS. ? You use needles to inject street drugs. ? You live with someone who has hepatitis B. ? You have had sex with someone who has hepatitis B. ? You get hemodialysis treatment. ? You take certain medicines for conditions, including cancer, organ transplantation, and autoimmune conditions.  Hepatitis C  Blood testing is recommended for: ? Everyone born from [birth year] through 1966. ? Anyone with known risk factors for hepatitis C.  Sexually transmitted infections (STIs)  You should be screened for sexually transmitted infections (STIs) including gonorrhea and chlamydia if: ? You are sexually active and are younger than 43 years of age. ? You are older than 43 years of age and your health care provider tells you that you are at risk for this type of infection. ? Your sexual activity has changed since you were last screened and you are at an increased risk for chlamydia or gonorrhea. Ask your health care provider if you are at risk.  If you do not have HIV, but are at risk, it may be recommended that you take a prescription medicine daily to prevent HIV infection. This is called pre-exposure prophylaxis (PrEP). You are considered at risk if: ? You are sexually active and do not regularly use condoms or know the HIV status of your partner(s). ? You take drugs by  injection. ? You are sexually active with a partner who has HIV.  Talk with your health care provider about whether you are at high risk of being infected with HIV. If you choose to begin PrEP, you should first be tested for HIV. You should then be tested every 3 months for as long as you are taking PrEP. Pregnancy  If you are premenopausal and you may become pregnant, ask your health care provider about preconception counseling.  If you may become pregnant, take 400 to 800 micrograms (mcg) of folic acid every day.  If you want to prevent pregnancy, talk to your health care provider about birth control (contraception). Osteoporosis and menopause  Osteoporosis is a disease in which the bones lose minerals and strength with aging. This can result in serious bone fractures. Your risk for osteoporosis can be identified using a bone density scan.  If you are 20 years of age or older, or if you are at risk for osteoporosis and fractures, ask your health care provider if you should be screened.  Ask your health care provider whether you should take a calcium or vitamin D supplement to lower your risk for osteoporosis.  Menopause may have certain physical symptoms and risks.  Hormone replacement therapy may reduce some of these symptoms and risks. Talk to your health care provider about whether hormone replacement therapy is right for you. Follow these instructions at home:  Schedule regular health, dental, and eye exams.  Stay current with your immunizations.  Do not use any tobacco products including cigarettes, chewing tobacco, or electronic cigarettes.  If you are pregnant, do not drink alcohol.  If you are breastfeeding, limit how much and how often you drink alcohol.  Limit alcohol intake to no more than 1 drink per day for nonpregnant women. One drink equals 12 ounces of beer, 5 ounces of wine, or 1 ounces of hard liquor.  Do not use street drugs.  Do not share needles.  Ask  your health care provider for help if you need support or information about quitting drugs.  Tell your health care provider if you often feel depressed.  Tell your health care provider if you have ever been abused or do not feel safe at home. This information is not intended to replace advice given to you by your health care provider. Make sure you discuss any questions you have with your health care provider. Document Released: 07/13/2010 Document Revised: 06/05/2015 Document Reviewed: 10/01/2014 Elsevier Interactive Patient Education  2018 [place] Eating Plan DASH stands for "Dietary Approaches to Stop Hypertension." The DASH eating plan is a healthy eating plan that has been shown to reduce high blood pressure (hypertension). It may also reduce your risk for type 2 diabetes, heart disease, and stroke. The DASH eating plan may also help with weight loss. What are tips for following this plan? General guidelines  Avoid eating more than 2,300 mg (milligrams) of salt (sodium) a day. If you have hypertension, you may need to reduce your sodium intake to 1,500 mg a day.  Limit alcohol intake to no more than 1 drink a day for nonpregnant women and 2 drinks a day for men. One drink equals 12 oz of beer, 5 oz of wine, or 1 oz of hard liquor.  Work with your health care provider to maintain a healthy body weight or to lose weight. Ask what an ideal weight is for you.  Get at least 30 minutes of exercise that causes your heart to beat faster (aerobic exercise) most days of the week. Activities may include walking, swimming, or biking.  Work with your health care provider or diet and nutrition specialist (dietitian) to adjust your eating plan to your individual calorie needs. Reading food labels  Check food labels for the amount of sodium per serving. Choose foods with less than 5 percent of the Daily Value of sodium. Generally, foods with less than 300 mg of sodium per serving fit  into  this eating plan.  To find whole grains, look for the word "whole" as the first word in the ingredient list. Shopping  Buy products labeled as "low-sodium" or "no salt added."  Buy fresh foods. Avoid canned foods and premade or frozen meals. Cooking  Avoid adding salt when cooking. Use salt-free seasonings or herbs instead of table salt or sea salt. Check with your health care provider or pharmacist before using salt substitutes.  Do not fry foods. Cook foods using healthy methods such as baking, boiling, grilling, and broiling instead.  Cook with heart-healthy oils, such as olive, canola, soybean, or sunflower oil. Meal planning   Eat a balanced diet that includes: ? 5 or more servings of fruits and vegetables each day. At each meal, try to fill half of your plate with fruits and vegetables. ? Up to 6-8 servings of whole grains each day. ? Less than 6 oz of lean meat, poultry, or fish each day. A 3-oz serving of meat is about the same size as a deck of cards. One egg equals 1 oz. ? 2 servings of low-fat dairy each day. ? A serving of nuts, seeds, or beans 5 times each week. ? Heart-healthy fats. Healthy fats called Omega-3 fatty acids are found in foods such as flaxseeds and coldwater fish, like sardines, salmon, and mackerel.  Limit how much you eat of the following: ? Canned or prepackaged foods. ? Food that is high in trans fat, such as fried foods. ? Food that is high in saturated fat, such as fatty meat. ? Sweets, desserts, sugary drinks, and other foods with added sugar. ? Full-fat dairy products.  Do not salt foods before eating.  Try to eat at least 2 vegetarian meals each week.  Eat more home-cooked food and less restaurant, buffet, and fast food.  When eating at a restaurant, ask that your food be prepared with less salt or no salt, if possible. What foods are recommended? The items listed may not be a complete list. Talk with your dietitian about what dietary  choices are best for you. Grains Whole-grain or whole-wheat bread. Whole-grain or whole-wheat pasta. Brown rice. [place]. Bulgur. Whole-grain and low-sodium cereals. Pita bread. Low-fat, low-sodium crackers. Whole-wheat flour tortillas. Vegetables Fresh or frozen vegetables (raw, steamed, roasted, or grilled). Low-sodium or reduced-sodium tomato and vegetable juice. Low-sodium or reduced-sodium tomato sauce and tomato paste. Low-sodium or reduced-sodium canned vegetables. Fruits All fresh, dried, or frozen fruit. Canned fruit in natural juice (without added sugar). Meat and other protein foods Skinless chicken or [place]. Ground chicken or [place]. Pork with fat trimmed off. Fish and seafood. Egg whites. Dried beans, peas, or lentils. Unsalted nuts, nut butters, and seeds. Unsalted canned beans. Lean cuts of beef with fat trimmed off. Low-sodium, lean deli meat. Dairy Low-fat (1%) or fat-free (skim) milk. Fat-free, low-fat, or reduced-fat cheeses. Nonfat, low-sodium ricotta or cottage cheese. Low-fat or nonfat yogurt. Low-fat, low-sodium cheese. Fats and oils Soft margarine without trans fats. Vegetable oil. Low-fat, reduced-fat, or light mayonnaise and salad dressings (reduced-sodium). Canola, safflower, olive, soybean, and sunflower oils. Avocado. Seasoning and other foods Herbs. Spices. Seasoning mixes without salt. Unsalted popcorn and pretzels. Fat-free sweets. What foods are not recommended? The items listed may not be a complete list. Talk with your dietitian about what dietary choices are best for you. Grains Baked goods made with fat, such as croissants, muffins, or some breads. Dry pasta or rice meal packs. Vegetables Creamed or fried vegetables. Vegetables in a  cheese sauce. Regular canned vegetables (not low-sodium or reduced-sodium). Regular canned tomato sauce and paste (not low-sodium or reduced-sodium). Regular tomato and vegetable juice (not low-sodium or reduced-sodium).  Angie Fava. Olives. Fruits Canned fruit in a light or heavy syrup. Fried fruit. Fruit in cream or butter sauce. Meat and other protein foods Fatty cuts of meat. Ribs. Fried meat. Berniece Salines. Sausage. Bologna and other processed lunch meats. Salami. Fatback. Hotdogs. Bratwurst. Salted nuts and seeds. Canned beans with added salt. Canned or smoked fish. Whole eggs or egg yolks. Chicken or [place] with skin. Dairy Whole or 2% milk, cream, and half-and-half. Whole or full-fat cream cheese. Whole-fat or sweetened yogurt. Full-fat cheese. Nondairy creamers. Whipped toppings. Processed cheese and cheese spreads. Fats and oils Butter. Stick margarine. Lard. Shortening. Ghee. Bacon fat. Tropical oils, such as coconut, palm kernel, or palm oil. Seasoning and other foods Salted popcorn and pretzels. Onion salt, garlic salt, seasoned salt, table salt, and sea salt. Worcestershire sauce. Tartar sauce. Barbecue sauce. Teriyaki sauce. Soy sauce, including reduced-sodium. Steak sauce. Canned and packaged gravies. Fish sauce. Oyster sauce. Cocktail sauce. Horseradish that you find on the shelf. Ketchup. Mustard. Meat flavorings and tenderizers. Bouillon cubes. Hot sauce and Tabasco sauce. Premade or packaged marinades. Premade or packaged taco seasonings. Relishes. Regular salad dressings. Where to find more information:  National Heart, Lung, and [place]: https://wilson-eaton.com/  American Heart Association: www.heart.org Summary  The DASH eating plan is a healthy eating plan that has been shown to reduce high blood pressure (hypertension). It may also reduce your risk for type 2 diabetes, heart disease, and stroke.  With the DASH eating plan, you should limit salt (sodium) intake to 2,300 mg a day. If you have hypertension, you may need to reduce your sodium intake to 1,500 mg a day.  When on the DASH eating plan, aim to eat more fresh fruits and vegetables, whole grains, lean proteins, low-fat dairy, and  heart-healthy fats.  Work with your health care provider or diet and nutrition specialist (dietitian) to adjust your eating plan to your individual calorie needs. This information is not intended to replace advice given to you by your health care provider. Make sure you discuss any questions you have with your health care provider. Document Released: 12/17/2010 Document Revised: 12/22/2015 Document Reviewed: 12/22/2015 Elsevier Interactive Patient Education  [employer].

## 2017-12-08 LAB — C. TRACHOMATIS/N. GONORRHOEAE RNA
C. trachomatis RNA, TMA: NOT DETECTED
N. gonorrhoeae RNA, TMA: NOT DETECTED

## 2017-12-09 LAB — PAP, TP IMAGING W/ HPV RNA, RFLX HPV TYPE 16,18/45: HPV DNA High Risk: NOT DETECTED

## 2017-12-15 ENCOUNTER — Telehealth: Payer: Self-pay | Admitting: *Deleted

## 2017-12-15 NOTE — Telephone Encounter (Signed)
Patient called informed with normal pap and negative STD cultures from visit on 12/07/17

## 2018-01-12 ENCOUNTER — Ambulatory Visit: Payer: BLUE CROSS/BLUE SHIELD

## 2018-01-23 ENCOUNTER — Ambulatory Visit (INDEPENDENT_AMBULATORY_CARE_PROVIDER_SITE_OTHER): Payer: BLUE CROSS/BLUE SHIELD | Admitting: Anesthesiology

## 2018-01-23 DIAGNOSIS — Z3042 Encounter for surveillance of injectable contraceptive: Secondary | ICD-10-CM

## 2018-01-23 MED ORDER — MEDROXYPROGESTERONE ACETATE 150 MG/ML IM SUSP
150.0000 mg | Freq: Once | INTRAMUSCULAR | Status: AC
  Administered 2018-01-23: 150 mg via INTRAMUSCULAR

## 2018-04-18 ENCOUNTER — Telehealth: Payer: Self-pay | Admitting: *Deleted

## 2018-04-18 MED ORDER — MEDROXYPROGESTERONE ACETATE 150 MG/ML IM SUSP: 150.0000 mg | INTRAMUSCULAR | 2 refills | Status: DC

## 2018-04-18 NOTE — Telephone Encounter (Signed)
Patient called Rx for Depo Provera must now go to mail order [facility]. Rx sent with refill until Nov. 2020 annual

## 2018-04-25 ENCOUNTER — Ambulatory Visit (INDEPENDENT_AMBULATORY_CARE_PROVIDER_SITE_OTHER): Payer: BLUE CROSS/BLUE SHIELD | Admitting: Anesthesiology

## 2018-04-25 ENCOUNTER — Other Ambulatory Visit: Payer: Self-pay

## 2018-04-25 DIAGNOSIS — Z3042 Encounter for surveillance of injectable contraceptive: Secondary | ICD-10-CM | POA: Diagnosis not present

## 2018-04-25 MED ORDER — MEDROXYPROGESTERONE ACETATE 150 MG/ML IM SUSP
150.0000 mg | Freq: Once | INTRAMUSCULAR | Status: AC
  Administered 2018-04-25: 09:00:00 150 mg via INTRAMUSCULAR

## 2018-05-11 ENCOUNTER — Other Ambulatory Visit: Payer: Self-pay

## 2018-05-15 ENCOUNTER — Ambulatory Visit: Payer: BLUE CROSS/BLUE SHIELD | Admitting: Women's Health

## 2018-07-18 ENCOUNTER — Other Ambulatory Visit: Payer: Self-pay

## 2018-07-18 ENCOUNTER — Ambulatory Visit (INDEPENDENT_AMBULATORY_CARE_PROVIDER_SITE_OTHER): Payer: BC Managed Care – PPO | Admitting: *Deleted

## 2018-07-18 DIAGNOSIS — Z3042 Encounter for surveillance of injectable contraceptive: Secondary | ICD-10-CM | POA: Diagnosis not present

## 2018-07-18 MED ORDER — MEDROXYPROGESTERONE ACETATE 150 MG/ML IM SUSP
150.0000 mg | Freq: Once | INTRAMUSCULAR | Status: AC
  Administered 2018-07-18: 09:00:00 150 mg via INTRAMUSCULAR

## 2018-09-11 ENCOUNTER — Other Ambulatory Visit: Payer: Self-pay

## 2018-09-12 ENCOUNTER — Ambulatory Visit (INDEPENDENT_AMBULATORY_CARE_PROVIDER_SITE_OTHER): Payer: BC Managed Care – PPO | Admitting: Women's Health

## 2018-09-12 ENCOUNTER — Encounter: Payer: Self-pay | Admitting: Women's Health

## 2018-09-12 VITALS — BP 124/80

## 2018-09-12 DIAGNOSIS — B9689 Other specified bacterial agents as the cause of diseases classified elsewhere: Secondary | ICD-10-CM

## 2018-09-12 DIAGNOSIS — N760 Acute vaginitis: Secondary | ICD-10-CM

## 2018-09-12 LAB — WET PREP FOR TRICH, YEAST, CLUE

## 2018-09-12 MED ORDER — METRONIDAZOLE 0.75 % VA GEL: VAGINAL | 0 refills | Status: DC

## 2018-09-12 NOTE — Progress Notes (Signed)
43 year old SBF G5 P3, 1 deceased presents with complaint of questionable bacterial vaginosis.  States has had increased discharge with an odor for the past week, has had BV several times in the past.  Denies vaginal itching, urinary symptoms, abdominal/ back pain, or fever.  Amenorrheic on Depo-Provera, same partner denies need for STD screen.  History of hypertension currently on no medication and stabilized blood pressure.  Exam: Appears well.  No CVAT.  Abdomen soft, nontender, external genitalia within normal limits, speculum exam moderate amount of an adherent discharge with odor noted, wet prep positive for clues, TNTC bacteria.  Bimanual no CMT or adnexal tenderness.  Bacterial vaginosis  Plan: Options reviewed, prefers MetroGel vaginal cream 1 applicator at bedtime x5, alcohol precautions reviewed.  Options reviewed for prevention discussed will try boric acid gelcaps 600 mg per vagina twice weekly will get through [employer].  Instructed to call if continued problems or if no resolution of symptoms after MetroGel.

## 2018-09-12 NOTE — Patient Instructions (Signed)
Boric acid gel caps 600mg  one per vagina twice weekly  Bacterial Vaginosis  Bacterial vaginosis is a vaginal infection that occurs when the normal balance of bacteria in the vagina is disrupted. It results from an overgrowth of certain bacteria. This is the most common vaginal infection among women ages 4215-44. Because bacterial vaginosis increases your risk for STIs (sexually transmitted infections), getting treated can help reduce your risk for chlamydia, gonorrhea, herpes, and HIV (human immunodeficiency virus). Treatment is also important for preventing complications in pregnant women, because this condition can cause an early (premature) delivery. What are the causes? This condition is caused by an increase in harmful bacteria that are normally present in small amounts in the vagina. However, the reason that the condition develops is not fully understood. What increases the risk? The following factors may make you more likely to develop this condition:  Having a new sexual partner or multiple sexual partners.  Having unprotected sex.  Douching.  Having an intrauterine device (IUD).  Smoking.  Drug and alcohol abuse.  Taking certain antibiotic medicines.  Being pregnant. You cannot get bacterial vaginosis from toilet seats, bedding, swimming pools, or contact with objects around you. What are the signs or symptoms? Symptoms of this condition include:  Grey or white vaginal discharge. The discharge can also be watery or foamy.  A fish-like odor with discharge, especially after sexual intercourse or during menstruation.  Itching in and around the vagina.  Burning or pain with urination. Some women with bacterial vaginosis have no signs or symptoms. How is this diagnosed? This condition is diagnosed based on:  Your medical history.  A physical exam of the vagina.  Testing a sample of vaginal fluid under a microscope to look for a large amount of bad bacteria or abnormal  cells. Your health care provider may use a cotton swab or a small wooden spatula to collect the sample. How is this treated? This condition is treated with antibiotics. These may be given as a pill, a vaginal cream, or a medicine that is put into the vagina (suppository). If the condition comes back after treatment, a second round of antibiotics may be needed. Follow these instructions at home: Medicines  Take over-the-counter and prescription medicines only as told by your health care provider.  Take or use your antibiotic as told by your health care provider. Do not stop taking or using the antibiotic even if you start to feel better. General instructions  If you have a female sexual partner, tell her that you have a vaginal infection. She should see her health care provider and be treated if she has symptoms. If you have a female sexual partner, he does not need treatment.  During treatment: ? Avoid sexual activity until you finish treatment. ? Do not douche. ? Avoid alcohol as directed by your health care provider. ? Avoid breastfeeding as directed by your health care provider.  Drink enough water and fluids to keep your urine clear or pale yellow.  Keep the area around your vagina and rectum clean. ? Wash the area daily with warm water. ? Wipe yourself from front to back after using the toilet.  Keep all follow-up visits as told by your health care provider. This is important. How is this prevented?  Do not douche.  Wash the outside of your vagina with warm water only.  Use protection when having sex. This includes latex condoms and dental dams.  Limit how many sexual partners you have. To help prevent bacterial  vaginosis, it is best to have sex with just one partner (monogamous).  Make sure you and your sexual partner are tested for STIs.  Wear cotton or cotton-lined underwear.  Avoid wearing tight pants and pantyhose, especially during summer.  Limit the amount of  alcohol that you drink.  Do not use any products that contain nicotine or tobacco, such as cigarettes and e-cigarettes. If you need help quitting, ask your health care provider.  Do not use illegal drugs. Where to find more information  Centers for Disease Control and Prevention: AppraiserFraud.fi  American Sexual Health Association (ASHA): www.ashastd.org  [place].S[place] Department of Health and Financial controller, Office on Women's Health: DustingSprays.pl or SecuritiesCard.it Contact a health care provider if:  Your symptoms do not improve, even after treatment.  You have more discharge or pain when urinating.  You have a fever.  You have pain in your abdomen.  You have pain during sex.  You have vaginal bleeding between periods. Summary  Bacterial vaginosis is a vaginal infection that occurs when the normal balance of bacteria in the vagina is disrupted.  Because bacterial vaginosis increases your risk for STIs (sexually transmitted infections), getting treated can help reduce your risk for chlamydia, gonorrhea, herpes, and HIV (human immunodeficiency virus). Treatment is also important for preventing complications in pregnant women, because the condition can cause an early (premature) delivery.  This condition is treated with antibiotic medicines. These may be given as a pill, a vaginal cream, or a medicine that is put into the vagina (suppository). This information is not intended to replace advice given to you by your health care provider. Make sure you discuss any questions you have with your health care provider. Document Released: 12/28/2004 Document Revised: 12/10/2016 Document Reviewed: 09/13/2015 Elsevier Patient Education  2020 [employer].

## 2018-10-03 ENCOUNTER — Encounter: Payer: Self-pay | Admitting: Gynecology

## 2018-10-04 ENCOUNTER — Other Ambulatory Visit: Payer: Self-pay

## 2018-10-05 ENCOUNTER — Ambulatory Visit (INDEPENDENT_AMBULATORY_CARE_PROVIDER_SITE_OTHER): Payer: BC Managed Care – PPO | Admitting: *Deleted

## 2018-10-05 DIAGNOSIS — Z3042 Encounter for surveillance of injectable contraceptive: Secondary | ICD-10-CM

## 2018-10-05 MED ORDER — MEDROXYPROGESTERONE ACETATE 150 MG/ML IM SUSP
150.0000 mg | Freq: Once | INTRAMUSCULAR | Status: AC
  Administered 2018-10-05: 08:00:00 150 mg via INTRAMUSCULAR

## 2018-12-11 ENCOUNTER — Encounter: Payer: BC Managed Care – PPO | Admitting: Women's Health

## 2018-12-20 ENCOUNTER — Encounter: Payer: BC Managed Care – PPO | Admitting: Women's Health

## 2018-12-26 ENCOUNTER — Other Ambulatory Visit: Payer: Self-pay

## 2018-12-26 ENCOUNTER — Ambulatory Visit (INDEPENDENT_AMBULATORY_CARE_PROVIDER_SITE_OTHER): Payer: BC Managed Care – PPO | Admitting: *Deleted

## 2018-12-26 DIAGNOSIS — Z3042 Encounter for surveillance of injectable contraceptive: Secondary | ICD-10-CM

## 2018-12-26 MED ORDER — MEDROXYPROGESTERONE ACETATE 150 MG/ML IM SUSP
150.0000 mg | Freq: Once | INTRAMUSCULAR | Status: AC
  Administered 2018-12-26: 09:00:00 150 mg via INTRAMUSCULAR

## 2019-03-21 ENCOUNTER — Other Ambulatory Visit: Payer: Self-pay | Admitting: *Deleted

## 2019-03-21 NOTE — Telephone Encounter (Signed)
Patient scheduled annual exam on 04/23/19. Patient states her depo Rx is sent to mail order and not local pharmacy. Reports [employer] pay for medication cheaper. She asked if she could use depo shot here when do if current Rx is not mailed to her in time. I explained yes,but she will need to accept the cost or bill that may be attached to having shot at the office verses me sending Rx to local pharmacy. Patient verbalized she understood.

## 2019-03-21 NOTE — Telephone Encounter (Signed)
Spoke with patient she schedule for April 12.

## 2019-03-21 NOTE — Telephone Encounter (Signed)
Patient called requesting refill on depo provera, overdue for annual exam. Will route to appointment desk to schedule so Rx can be sent.

## 2019-03-23 ENCOUNTER — Other Ambulatory Visit: Payer: Self-pay

## 2019-03-23 ENCOUNTER — Ambulatory Visit: Payer: BC Managed Care – PPO

## 2019-03-26 ENCOUNTER — Other Ambulatory Visit: Payer: Self-pay

## 2019-03-26 ENCOUNTER — Ambulatory Visit (INDEPENDENT_AMBULATORY_CARE_PROVIDER_SITE_OTHER): Payer: BC Managed Care – PPO | Admitting: *Deleted

## 2019-03-26 DIAGNOSIS — Z3042 Encounter for surveillance of injectable contraceptive: Secondary | ICD-10-CM

## 2019-03-26 MED ORDER — MEDROXYPROGESTERONE ACETATE 150 MG/ML IM SUSP
150.0000 mg | Freq: Once | INTRAMUSCULAR | Status: AC
  Administered 2019-03-26: 150 mg via INTRAMUSCULAR

## 2019-04-23 ENCOUNTER — Encounter: Payer: BC Managed Care – PPO | Admitting: Women's Health

## 2019-04-23 DIAGNOSIS — Z0289 Encounter for other administrative examinations: Secondary | ICD-10-CM

## 2019-06-12 ENCOUNTER — Other Ambulatory Visit: Payer: Self-pay

## 2019-06-25 ENCOUNTER — Ambulatory Visit (INDEPENDENT_AMBULATORY_CARE_PROVIDER_SITE_OTHER): Payer: Self-pay | Admitting: *Deleted

## 2019-06-25 ENCOUNTER — Other Ambulatory Visit: Payer: Self-pay

## 2019-06-25 DIAGNOSIS — Z3042 Encounter for surveillance of injectable contraceptive: Secondary | ICD-10-CM

## 2019-06-25 MED ORDER — MEDROXYPROGESTERONE ACETATE 150 MG/ML IM SUSP
150.0000 mg | Freq: Once | INTRAMUSCULAR | Status: AC
  Administered 2019-06-25: 150 mg via INTRAMUSCULAR

## 2019-09-24 ENCOUNTER — Ambulatory Visit (INDEPENDENT_AMBULATORY_CARE_PROVIDER_SITE_OTHER): Payer: Self-pay | Admitting: *Deleted

## 2019-09-24 ENCOUNTER — Other Ambulatory Visit: Payer: Self-pay

## 2019-09-24 DIAGNOSIS — Z3042 Encounter for surveillance of injectable contraceptive: Secondary | ICD-10-CM

## 2019-09-24 MED ORDER — MEDROXYPROGESTERONE ACETATE 150 MG/ML IM SUSP
150.0000 mg | Freq: Once | INTRAMUSCULAR | Status: AC
  Administered 2019-09-24: 10:00:00 150 mg via INTRAMUSCULAR

## 2019-12-19 ENCOUNTER — Ambulatory Visit (INDEPENDENT_AMBULATORY_CARE_PROVIDER_SITE_OTHER): Payer: Self-pay | Admitting: *Deleted

## 2019-12-19 ENCOUNTER — Other Ambulatory Visit: Payer: Self-pay

## 2019-12-19 DIAGNOSIS — Z3042 Encounter for surveillance of injectable contraceptive: Secondary | ICD-10-CM

## 2019-12-19 MED ORDER — MEDROXYPROGESTERONE ACETATE 150 MG/ML IM SUSP
150.0000 mg | Freq: Once | INTRAMUSCULAR | Status: AC
  Administered 2019-12-19: 150 mg via INTRAMUSCULAR

## 2020-03-10 ENCOUNTER — Other Ambulatory Visit: Payer: Self-pay

## 2020-03-10 ENCOUNTER — Ambulatory Visit (INDEPENDENT_AMBULATORY_CARE_PROVIDER_SITE_OTHER): Payer: BLUE CROSS/BLUE SHIELD

## 2020-03-10 DIAGNOSIS — Z3042 Encounter for surveillance of injectable contraceptive: Secondary | ICD-10-CM | POA: Diagnosis not present

## 2020-03-10 MED ORDER — MEDROXYPROGESTERONE ACETATE 150 MG/ML IM SUSP
150.0000 mg | Freq: Once | INTRAMUSCULAR | Status: AC
  Administered 2020-03-10: 150 mg via INTRAMUSCULAR

## 2020-03-10 NOTE — Progress Notes (Signed)
Patient is here for Depo Provera Injection Patient is within Depo Provera Calender Limits yes Next Depo Due between: 05/26/20 - 05/3020 Last AEX:  AEX Scheduled: 03-20-20  Patient is aware when next depo is due 5/16 -5/30.  Pt tolerated Injection well in RUOQ.  Routed to provider for review, encounter closed.

## 2020-03-20 ENCOUNTER — Other Ambulatory Visit: Payer: Self-pay

## 2020-03-20 ENCOUNTER — Encounter: Payer: Self-pay | Admitting: Nurse Practitioner

## 2020-03-20 ENCOUNTER — Ambulatory Visit (INDEPENDENT_AMBULATORY_CARE_PROVIDER_SITE_OTHER): Payer: BLUE CROSS/BLUE SHIELD | Admitting: Nurse Practitioner

## 2020-03-20 VITALS — BP 120/80 | Ht 66.75 in | Wt 146.0 lb

## 2020-03-20 DIAGNOSIS — Z9079 Acquired absence of other genital organ(s): Secondary | ICD-10-CM | POA: Diagnosis not present

## 2020-03-20 DIAGNOSIS — Z3042 Encounter for surveillance of injectable contraceptive: Secondary | ICD-10-CM

## 2020-03-20 DIAGNOSIS — Z8742 Personal history of other diseases of the female genital tract: Secondary | ICD-10-CM

## 2020-03-20 DIAGNOSIS — Z90721 Acquired absence of ovaries, unilateral: Secondary | ICD-10-CM

## 2020-03-20 DIAGNOSIS — Z01419 Encounter for gynecological examination (general) (routine) without abnormal findings: Secondary | ICD-10-CM

## 2020-03-20 DIAGNOSIS — Z9889 Other specified postprocedural states: Secondary | ICD-10-CM | POA: Diagnosis not present

## 2020-03-20 DIAGNOSIS — R1032 Left lower quadrant pain: Secondary | ICD-10-CM

## 2020-03-20 MED ORDER — MEDROXYPROGESTERONE ACETATE 150 MG/ML IM SUSP: 150.0000 mg | INTRAMUSCULAR | 3 refills | Status: DC

## 2020-03-20 NOTE — Progress Notes (Signed)
   Kerry Salazar Jun 06, 1975 [id number]   History:  45 y.o. [id number] presents for annual exam. Amenorrheic/Depo Provera (last injection 03/10/2020). Normal pap history. Has not had screening mammogram. She complains of intermittent left lower abdominal pain. History of ovarian cyst removal and RSO in 1998.   Gynecologic History No LMP recorded. Patient has had an injection.   Contraception/Family planning: Depo-Provera injections  Health Maintenance Last Pap: 12/07/2017. Results were: normal Last mammogram: Never  Last colonoscopy: N/A  Last Dexa: N/A   Past medical history, past surgical history, family history and social history were all reviewed and documented in the EPIC chart.  ROS:  A ROS was performed and pertinent positives and negatives are included.  Exam:  Vitals:   03/20/20 0803  BP: 120/80  Weight: 146 lb (66.2 kg)  Height: 5' 6.75" (1.695 m)   Body mass index is 23.04 kg/m.  General appearance:  Normal Thyroid:  Symmetrical, normal in size, without palpable masses or nodularity. Respiratory  Auscultation:  Clear without wheezing or rhonchi Cardiovascular  Auscultation:  Regular rate, without rubs, murmurs or gallops  Edema/varicosities:  Not grossly evident Abdominal  Soft,nontender, without masses, guarding or rebound.  Liver/spleen:  No organomegaly noted  Hernia:  None appreciated  Skin  Inspection:  Grossly normal   Breasts: Examined lying and sitting.   Right: Without masses, retractions, discharge or axillary adenopathy.   Left: Without masses, retractions, discharge or axillary adenopathy. Gentitourinary   Inguinal/mons:  Normal without inguinal adenopathy  External genitalia:  Normal  BUS/Urethra/Skene's glands:  Normal  Vagina:  Normal  Cervix:  Normal  Uterus:  Normal in size, shape and contour.  Midline and mobile  Adnexa/parametria:     Rt: Without masses or tenderness.   Lt: Without masses or tenderness.  Anus and  perineum: Normal  Digital rectal exam: Normal sphincter tone without palpated masses or tenderness  Assessment/Plan:  45 y.o. [id number] for annual exam.   Well female exam with routine gynecological exam - Plan: CBC with Differential/Platelet, Comprehensive metabolic panel, Lipid panel. Education provided on SBEs, importance of preventative screenings, current guidelines, high calcium diet, regular exercise, and multivitamin daily.   Encounter for surveillance of injectable contraceptive - Plan: medroxyPROGESTERone (DEPO-PROVERA) 150 MG/ML injection. Amenorrheic. History of menorrhagia. Last given 03/10/2020.  History of right salpingo-oophorectomy - 1998 - Plan: US PELVIS TRANSVAGINAL NON-OB (TV ONLY) for complicated cyst removal.  History of removal of ovarian cyst - Plan: US PELVIS TRANSVAGINAL NON-OB (TV ONLY)  LLQ abdominal pain - Plan: US PELVIS TRANSVAGINAL NON-OB (TV ONLY). Pain is intermittent and located in left lower abdominal. Hx of ovarian cyst removal/RSO. Most recent ultrasound in 2014 showed 3 complicated cysts within left ovary.   Screening for cervical cancer - Normal Pap history.  Will repeat at 5-year interval per guidelines.  Screening for breast cancer - Has not had screening mammogram. Discussed current guidelines and importance of preventative screenings. Information provided on [facility]. She plans to schedule this soon now that she has insurance. Normal breast exam today.  Follow up in 1 year for annual.         Kerry Salazar [facility], 8:28 AM 03/20/2020

## 2020-03-20 NOTE — Patient Instructions (Addendum)
Schedule Mammogram! [facility] [phone] [address]  [place], [place] [address]  Health Maintenance, Female Adopting a healthy lifestyle and getting preventive care are important in promoting health and wellness. Ask your health care provider about:  The right schedule for you to have regular tests and exams.  Things you can do on your own to prevent diseases and keep yourself healthy. What should I know about diet, weight, and exercise? Eat a healthy diet  Eat a diet that includes plenty of vegetables, fruits, low-fat dairy products, and lean protein.  Do not eat a lot of foods that are high in solid fats, added sugars, or sodium.   Maintain a healthy weight Body mass index (BMI) is used to identify weight problems. It estimates body fat based on height and weight. Your health care provider can help determine your BMI and help you achieve or maintain a healthy weight. Get regular exercise Get regular exercise. This is one of the most important things you can do for your health. Most adults should:  Exercise for at least 150 minutes each week. The exercise should increase your heart rate and make you sweat (moderate-intensity exercise).  Do strengthening exercises at least twice a week. This is in addition to the moderate-intensity exercise.  Spend less time sitting. Even light physical activity can be beneficial. Watch cholesterol and blood lipids Have your blood tested for lipids and cholesterol at 46 years of age, then have this test every 5 years. Have your cholesterol levels checked more often if:  Your lipid or cholesterol levels are high.  You are older than 46 years of age.  You are at high risk for heart disease. What should I know about cancer screening? Depending on your health history and family history, you may need to have cancer screening at various ages. This may include screening for:  Breast cancer.  Cervical  cancer.  Colorectal cancer.  Skin cancer.  Lung cancer. What should I know about heart disease, diabetes, and high blood pressure? Blood pressure and heart disease  High blood pressure causes heart disease and increases the risk of stroke. This is more likely to develop in people who have high blood pressure readings, are of African descent, or are overweight.  Have your blood pressure checked: ? Every 3-5 years if you are 18-39 years of age. ? Every year if you are 40 years old or older. Diabetes Have regular diabetes screenings. This checks your fasting blood sugar level. Have the screening done:  Once every three years after age 40 if you are at a normal weight and have a low risk for diabetes.  More often and at a younger age if you are overweight or have a high risk for diabetes. What should I know about preventing infection? Hepatitis B If you have a higher risk for hepatitis B, you should be screened for this virus. Talk with your health care provider to find out if you are at risk for hepatitis B infection. Hepatitis C Testing is recommended for:  Everyone born from 1945 through 1965.  Anyone with known risk factors for hepatitis C. Sexually transmitted infections (STIs)  Get screened for STIs, including gonorrhea and chlamydia, if: ? You are sexually active and are younger than 46 years of age. ? You are older than 46 years of age and your health care provider tells you that you are at risk for this type of infection. ? Your sexual activity has changed since   you were last screened, and you are at increased risk for chlamydia or gonorrhea. Ask your health care provider if you are at risk.  Ask your health care provider about whether you are at high risk for HIV. Your health care provider may recommend a prescription medicine to help prevent HIV infection. If you choose to take medicine to prevent HIV, you should first get tested for HIV. You should then be tested every 3  months for as long as you are taking the medicine. Pregnancy  If you are about to stop having your period (premenopausal) and you may become pregnant, seek counseling before you get pregnant.  Take 400 to 800 micrograms (mcg) of folic acid every day if you become pregnant.  Ask for birth control (contraception) if you want to prevent pregnancy. Osteoporosis and menopause Osteoporosis is a disease in which the bones lose minerals and strength with aging. This can result in bone fractures. If you are 7 years old or older, or if you are at risk for osteoporosis and fractures, ask your health care provider if you should:  Be screened for bone loss.  Take a calcium or vitamin D supplement to lower your risk of fractures.  Be given hormone replacement therapy (HRT) to treat symptoms of menopause. Follow these instructions at home: Lifestyle  Do not use any products that contain nicotine or tobacco, such as cigarettes, e-cigarettes, and chewing tobacco. If you need help quitting, ask your health care provider.  Do not use street drugs.  Do not share needles.  Ask your health care provider for help if you need support or information about quitting drugs. Alcohol use  Do not drink alcohol if: ? Your health care provider tells you not to drink. ? You are pregnant, may be pregnant, or are planning to become pregnant.  If you drink alcohol: ? Limit how much you use to 0-1 drink a day. ? Limit intake if you are breastfeeding.  Be aware of how much alcohol is in your drink. In the [place] one drink equals one 12 oz bottle of beer (355 mL), one 5 oz glass of wine (148 mL), or one 1 oz glass of hard liquor (44 mL). General instructions  Schedule regular health, dental, and eye exams.  Stay current with your vaccines.  Tell your health care provider if: ? You often feel depressed. ? You have ever been abused or do not feel safe at home. Summary  Adopting a healthy lifestyle and getting  preventive care are important in promoting health and wellness.  Follow your health care provider's instructions about healthy diet, exercising, and getting tested or screened for diseases.  Follow your health care provider's instructions on monitoring your cholesterol and blood pressure. This information is not intended to replace advice given to you by your health care provider. Make sure you discuss any questions you have with your health care provider. Document Revised: 12/21/2017 Document Reviewed: 12/21/2017 Elsevier Patient Education  2021 [employer].

## 2020-03-21 LAB — COMPREHENSIVE METABOLIC PANEL
AG Ratio: 1.1 (calc) (ref 1.0–2.5)
ALT: 10 U/L (ref 6–29)
AST: 12 U/L (ref 10–35)
Albumin: 3.9 g/dL (ref 3.6–5.1)
Alkaline phosphatase (APISO): 68 U/L (ref 31–125)
BUN: 7 mg/dL (ref 7–25)
CO2: 25 mmol/L (ref 20–32)
Calcium: 9.3 mg/dL (ref 8.6–10.2)
Chloride: 106 mmol/L (ref 98–110)
Creat: 0.67 mg/dL (ref 0.50–1.10)
Globulin: 3.4 g/dL (calc) (ref 1.9–3.7)
Glucose, Bld: 81 mg/dL (ref 65–99)
Potassium: 4.4 mmol/L (ref 3.5–5.3)
Sodium: 139 mmol/L (ref 135–146)
Total Bilirubin: 0.4 mg/dL (ref 0.2–1.2)
Total Protein: 7.3 g/dL (ref 6.1–8.1)

## 2020-03-21 LAB — CBC WITH DIFFERENTIAL/PLATELET
Absolute Monocytes: 635 cells/uL (ref 200–950)
Basophils Absolute: 48 cells/uL (ref 0–200)
Basophils Relative: 0.7 %
Eosinophils Absolute: 455 cells/uL (ref 15–500)
Eosinophils Relative: 6.6 %
HCT: 38 % (ref 35.0–45.0)
Hemoglobin: 12.7 g/dL (ref 11.7–15.5)
Lymphs Abs: 1283 cells/uL (ref 850–3900)
MCH: 28.5 pg (ref 27.0–33.0)
MCHC: 33.4 g/dL (ref 32.0–36.0)
MCV: 85.4 fL (ref 80.0–100.0)
MPV: 10.1 fL (ref 7.5–12.5)
Monocytes Relative: 9.2 %
Neutro Abs: 4478 cells/uL (ref 1500–7800)
Neutrophils Relative %: 64.9 %
Platelets: 408 10*3/uL — ABNORMAL HIGH (ref 140–400)
RBC: 4.45 10*6/uL (ref 3.80–5.10)
RDW: 13.6 % (ref 11.0–15.0)
Total Lymphocyte: 18.6 %
WBC: 6.9 10*3/uL (ref 3.8–10.8)

## 2020-03-21 LAB — LIPID PANEL
Cholesterol: 188 mg/dL (ref <200)
HDL: 46 mg/dL — ABNORMAL LOW (ref >=50)
LDL Cholesterol (Calc): 126 mg/dL (calc) — ABNORMAL HIGH
Non-HDL Cholesterol (Calc): 142 mg/dL (calc) — ABNORMAL HIGH (ref <130)
Total CHOL/HDL Ratio: 4.1 (calc) (ref <5.0)
Triglycerides: 64 mg/dL (ref <150)

## 2020-04-01 ENCOUNTER — Other Ambulatory Visit: Payer: Self-pay

## 2020-04-01 ENCOUNTER — Other Ambulatory Visit: Payer: BLUE CROSS/BLUE SHIELD

## 2020-04-01 ENCOUNTER — Other Ambulatory Visit: Payer: BLUE CROSS/BLUE SHIELD | Admitting: Obstetrics and Gynecology

## 2020-04-01 ENCOUNTER — Emergency Department (HOSPITAL_COMMUNITY)
Admission: EM | Admit: 2020-04-01 | Discharge: 2020-04-01 | Disposition: A | Discharge status: Home / Self Care | Payer: BLUE CROSS/BLUE SHIELD | Attending: Emergency Medicine | Admitting: Emergency Medicine

## 2020-04-01 ENCOUNTER — Emergency Department (HOSPITAL_COMMUNITY): Payer: BLUE CROSS/BLUE SHIELD

## 2020-04-01 DIAGNOSIS — R110 Nausea: Secondary | ICD-10-CM | POA: Insufficient documentation

## 2020-04-01 DIAGNOSIS — I10 Essential (primary) hypertension: Secondary | ICD-10-CM | POA: Diagnosis not present

## 2020-04-01 DIAGNOSIS — R0981 Nasal congestion: Secondary | ICD-10-CM | POA: Diagnosis not present

## 2020-04-01 DIAGNOSIS — R519 Headache, unspecified: Secondary | ICD-10-CM | POA: Diagnosis present

## 2020-04-01 MED ORDER — IBUPROFEN 400 MG PO TABS
600.0000 mg | ORAL_TABLET | Freq: Once | ORAL | Status: AC
  Administered 2020-04-01: 600 mg via ORAL
  Filled 2020-04-01: qty 1

## 2020-04-01 MED ORDER — ACETAMINOPHEN 325 MG PO TABS
650.0000 mg | ORAL_TABLET | Freq: Once | ORAL | Status: AC
  Administered 2020-04-01: 650 mg via ORAL
  Filled 2020-04-01: qty 2

## 2020-04-01 MED ORDER — DEXAMETHASONE SODIUM PHOSPHATE 4 MG/ML IJ SOLN
4.0000 mg | Freq: Once | INTRAMUSCULAR | Status: AC
  Administered 2020-04-01: 4 mg via INTRAMUSCULAR
  Filled 2020-04-01: qty 1

## 2020-04-01 NOTE — ED Notes (Signed)
States has had a HA for 6 days and family pressured her to come in today.  Currently denies HA, states tylenol and BC powder from home took her HA away.  Has hx of htn and was taken off her b/p meds several years ago.  Realizes she may need to go back on b/p meds

## 2020-04-01 NOTE — ED Triage Notes (Signed)
Patient c/o right sided headache unable to get relief from [facility] powder x 6 days. Stated some sinus congestion on the same side as well, ansd started using Claritin and flonase yesterday w/ no relief yet. Endorsed prior hx of BP but does not need medication anymore.

## 2020-04-01 NOTE — Discharge Instructions (Addendum)
Please continue taking Claritin daily as prescribed.  Please use Flonase 2 sprays into each nostril twice daily.  Use Nettie pot or similar product to do sinus rinses.  Please use Tylenol or ibuprofen for pain.  You may use 600 mg ibuprofen every 6 hours or 1000 mg of Tylenol every 6 hours.  You may choose to alternate between the 2.  This would be most effective.  Not to exceed 4 g of Tylenol within 24 hours.  Not to exceed 3200 mg ibuprofen 24 hours.  Please follow up with the ENT (ear nose and throat) doctor I have provided you information for.

## 2020-04-01 NOTE — ED Provider Notes (Signed)
[facility] EMERGENCY DEPARTMENT Provider Note   CSN: [id number] Arrival date & time: 04/01/20  0745     History Chief Complaint  Patient presents with  . Headache    Kerry Salazar is a 45 y.o. female.  HPI Patient is a 45 year old female with a past medical history significant for HTN states that she has no history of headaches is presented today with 6 days of right-sided headache with mild associated nausea.  She states she also has had a mild runny nose but states that she does not feel that she has sinus pressure or severe congestion.  She denies any cough fevers or chills.  No recent antibiotic usage and she denies any recent sick contacts.  No known COVID exposure and she is not interested in being tested today.  Patient denies any visual changes, lightheadedness or dizziness.  No other associate symptoms.  She states that headache is right-sided and constant seems to get better with Goody's powders.  Somewhat light sensitive.  No other associated symptoms and no other aggravating mitigating factors.     Past Medical History:  Diagnosis Date  . Headache(784.0)   . Hypertension     Patient Active Problem List   Diagnosis Date Noted  . Left ovarian cyst 2014-07-12  . Death of child   . Hypertension     Past Surgical History:  Procedure Laterality Date  . CESAREAN SECTION    . OOPHORECTOMY  1998   RSO  . OVARIAN CYST REMOVAL       OB History    Gravida  5   Para  3   Term  3   Preterm      AB  2   Living  2     SAB      IAB      Ectopic      Multiple      Live Births              Family History  Problem Relation Age of Onset  . Heart defect Son     Social History   Tobacco Use  . Smoking status: Never Smoker  . Smokeless tobacco: Never Used  Vaping Use  . Vaping Use: Never used  Substance Use Topics  . Alcohol use: No    Alcohol/week: 0.0 standard drinks    Comment: occ  . Drug use: No    Home  Medications Prior to Admission medications   Medication Sig Start Date End Date Taking? Authorizing Provider  fluticasone (FLONASE) 50 MCG/ACT nasal spray Place 1 spray into both nostrils daily.   Yes [provider]  loratadine (CLARITIN) 10 MG tablet Take 10 mg by mouth daily.   Yes [provider]  medroxyPROGESTERone (DEPO-PROVERA) 150 MG/ML injection Inject 1 mL (150 mg total) into the muscle every 3 (three) months. 03/20/20  Yes [name], NP    Allergies    Patient has no known allergies.  Review of Systems   Review of Systems  Constitutional: Negative for chills and fever.  HENT: Negative for congestion.   Eyes: Negative for pain.  Respiratory: Negative for cough and shortness of breath.   Cardiovascular: Negative for chest pain and leg swelling.  Gastrointestinal: Negative for abdominal pain and vomiting.  Genitourinary: Negative for dysuria.  Musculoskeletal: Negative for myalgias.  Skin: Negative for rash.  Neurological: Positive for headaches. Negative for dizziness.    Physical Exam Updated Vital Signs BP (!) 139/99  Pulse 87   Temp 97.7 F (36.5 C) (Oral)   Resp 17   Ht 5\' 7"  (1.702 m)   Wt 66.7 kg   SpO2 100%   BMI 23.02 kg/m   Physical Exam Vitals and nursing note reviewed.  Constitutional:      General: She is not in acute distress. HENT:     Head: Normocephalic and atraumatic.     Nose: Nose normal.     Comments: No significant congestion visualized No maxillary or frontal sinus tenderness to palpation.    Mouth/Throat:     Mouth: Mucous membranes are moist.  Eyes:     General: No scleral icterus. Cardiovascular:     Rate and Rhythm: Normal rate and regular rhythm.     Pulses: Normal pulses.     Heart sounds: Normal heart sounds.  Pulmonary:     Effort: Pulmonary effort is normal. No respiratory distress.     Breath sounds: No wheezing.  Abdominal:     Palpations: Abdomen is soft.     Tenderness: There is no  abdominal tenderness.  Musculoskeletal:     Cervical back: Normal range of motion.     Right lower leg: No edema.     Left lower leg: No edema.  Skin:    General: Skin is warm and dry.     Capillary Refill: Capillary refill takes less than 2 seconds.  Neurological:     Mental Status: She is alert. Mental status is at baseline.     Comments: Alert and oriented to self, place, time and event.   Speech is fluent, clear without dysarthria or dysphasia.   Strength 5/5 in upper/lower extremities  Sensation intact in upper/lower extremities   Normal gait.  CN I not tested  CN II grossly intact visual fields bilaterally. Did not visualize posterior eye.   CN III, IV, VI PERRLA and EOMs intact bilaterally  CN V Intact sensation to sharp and light touch to the face  CN VII facial movements symmetric  CN VIII not tested  CN IX, X no uvula deviation, symmetric rise of soft palate  CN XI 5/5 SCM and trapezius strength bilaterally  CN XII Midline tongue protrusion, symmetric L/R movements    Psychiatric:        Mood and Affect: Mood normal.        Behavior: Behavior normal.     ED Results / Procedures / Treatments   Labs (all labs ordered are listed, but only abnormal results are displayed) Labs Reviewed - No data to display  EKG None  Radiology CT Head Wo Contrast  Result Date: 04/01/2020 CLINICAL DATA:  Head EXAM: CT HEAD WITHOUT CONTRAST TECHNIQUE: Contiguous axial images were obtained from the base of the skull through the vertex without intravenous contrast. COMPARISON:  None. FINDINGS: Brain: Ventricles and sulci are normal in size and configuration. There is no intracranial mass, hemorrhage, extra-axial fluid collection, or midline shift. Brain parenchyma appears unremarkable. No evident acute infarct. Vascular: No hyperdense vessel. No appreciable vascular calcification. Skull: The bony calvarium appears intact. There is extensive frontal hyperostosis. There is extensive  anterior falcine calcification, benign in appearance. No associated edema or mass effect. Sinuses/Orbits: There is an air-fluid level in each maxillary antrum, larger on the right than on the left. There is opacification throughout much of the visualized right maxillary antrum. There is a small retention cyst in the posterior left maxillary antrum. There is opacification of multiple ethmoid air cells, more severe on the right  than the left. There is also opacification of right-sided frontal sinus. Visualized orbits appear symmetric bilaterally. Other: Mastoid air cells are clear. IMPRESSION: 1.  Multifocal paranasal sinus disease. 2. Extensive frontal hyperostosis as well as falcine calcification, benign in appearance. 3. Brain parenchyma appears unremarkable. No findings suggesting acute infarct. No mass or hemorrhage evident. Electronically Signed   By: Bretta Bang III M.D.   On: 04/01/2020 12:34    Procedures Procedures   Medications Ordered in ED Medications  acetaminophen (TYLENOL) tablet 650 mg (650 mg Oral Given 04/01/20 0805)  dexamethasone (DECADRON) injection 4 mg (4 mg Intramuscular Given 04/01/20 1301)  ibuprofen (ADVIL) tablet 600 mg (600 mg Oral Given 04/01/20 1301)    ED Course  I have reviewed the triage vital signs and the nursing notes.  Pertinent labs & imaging results that were available during my care of the patient were reviewed by me and considered in my medical decision making (see chart for details).  Patient is 45 year old female who denies any history of headaches.  Notably she does have headaches mentioned in her EMR however this appears to be not a frequent issue for her.  She is here today because of a 6-day episode of headaches which seems to get better with Goody's powder but tends to recur.  She began using Flonase and Zyrtec yesterday has had no improvement.  Patient is neurologically intact on my examination.  She does not seem to be congested at least not  visibly so and she has no frontal or maxillary sinus tenderness to palpation.  We will obtain CT head given the new headache with no history of similar headaches in the past.  Clinical Course as of 04/01/20 1307  Tue Apr 01, 2020  1256 CT head without evidence of mass or other acute disease  IMPRESSION: 1. Multifocal paranasal sinus disease.  2. Extensive frontal hyperostosis as well as falcine calcification, benign in appearance.  3. Brain parenchyma appears unremarkable. No findings suggesting acute infarct. No mass or hemorrhage evident. [WF]  1256 CT head unremarkable. Suspect this is sinus disease.  Will increase patient's Flonase usage to 2 sprays twice daily and encourage Zyrtec/loratadine. [WF]  1257   Will follow up with PCP and ENT as needed. [WF]  1257 Mildly hypertensive with no symptoms of such.  She will follow-up with her PCP. [WF]    Clinical Course User Index [WF] Gailen Shelter, [place]   MDM Rules/Calculators/A&P                          Suspect that this is related to patient's congestion.  CT head is within normal limits.  I discussed with patient importance of Flonase, loratadine and Nettie pot.  She will follow up with ENT if needed however will closely follow-up with PCP to review her blood pressure when she is asymptomatic from her headache.  Final Clinical Impression(s) / ED Diagnoses Final diagnoses:  Bad headache  Sinus congestion    Rx / DC Orders ED Discharge Orders    None       Gailen Shelter, [place] 04/01/20 1307    Wynetta Fines, MD 04/02/20 1443

## 2020-04-07 ENCOUNTER — Emergency Department (HOSPITAL_COMMUNITY)
Admission: EM | Admit: 2020-04-07 | Discharge: 2020-04-07 | Disposition: A | Discharge status: Home / Self Care | Payer: BLUE CROSS/BLUE SHIELD | Attending: Emergency Medicine | Admitting: Emergency Medicine

## 2020-04-07 ENCOUNTER — Other Ambulatory Visit: Payer: Self-pay

## 2020-04-07 DIAGNOSIS — R0981 Nasal congestion: Secondary | ICD-10-CM | POA: Diagnosis present

## 2020-04-07 DIAGNOSIS — I10 Essential (primary) hypertension: Secondary | ICD-10-CM | POA: Diagnosis not present

## 2020-04-07 DIAGNOSIS — J0100 Acute maxillary sinusitis, unspecified: Secondary | ICD-10-CM | POA: Diagnosis not present

## 2020-04-07 MED ORDER — ACETAMINOPHEN 325 MG PO TABS
650.0000 mg | ORAL_TABLET | Freq: Once | ORAL | Status: AC
  Administered 2020-04-07: 650 mg via ORAL
  Filled 2020-04-07: qty 2

## 2020-04-07 MED ORDER — AMOXICILLIN-POT CLAVULANATE 875-125 MG PO TABS
1.0000 | ORAL_TABLET | Freq: Once | ORAL | Status: AC
  Administered 2020-04-07: 1 via ORAL
  Filled 2020-04-07: qty 1

## 2020-04-07 MED ORDER — AMOXICILLIN-POT CLAVULANATE 875-125 MG PO TABS
1.0000 | ORAL_TABLET | Freq: Two times a day (BID) | ORAL | 0 refills | Status: DC
Start: 2020-04-07 — End: 2021-05-04

## 2020-04-07 NOTE — ED Triage Notes (Signed)
Patient c/o unresolved sinus headache since being seen last week; stated awaiting ENT OP follow up in a few days.

## 2020-04-07 NOTE — Discharge Instructions (Signed)
Return for worsening pain, fever.  Continue to take the allergy medicine.  Follow up with a family doc as well.

## 2020-04-07 NOTE — ED Provider Notes (Signed)
[facility] EMERGENCY DEPARTMENT Provider Note   CSN: [id number] Arrival date & time: 04/07/20  0755     History Chief Complaint  Patient presents with  . Headache    Kerry Salazar is a 45 y.o. female.  45 yo F with a chief complaint of sinus congestion.  This is been ongoing for the past week or so.  Was seen in the ED a few days ago with a negative work-up.  Patient has scheduled follow-up with ENT though has not yet made it to her appointment.  Having worsening pain to the right frontal sinus.  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  She thinks that may be attributing to it.  No fevers no trouble breathing no nausea or vomiting.  The history is provided by the patient.  Headache Associated symptoms: congestion and sinus pressure   Associated symptoms: no dizziness, no fever, no myalgias, no nausea and no vomiting   Illness Severity:  Moderate Onset quality:  Gradual Duration:  1 week Timing:  Constant Progression:  Worsening Chronicity:  New Associated symptoms: congestion, headaches and rhinorrhea   Associated symptoms: no chest pain, no fever, no myalgias, no nausea, no shortness of breath, no vomiting and no wheezing        Past Medical History:  Diagnosis Date  . Headache(784.0)   . Hypertension     Patient Active Problem List   Diagnosis Date Noted  . Left ovarian cyst 06-17-2014  . Death of child   . Hypertension     Past Surgical History:  Procedure Laterality Date  . CESAREAN SECTION    . OOPHORECTOMY  1998   RSO  . OVARIAN CYST REMOVAL       OB History    Gravida  5   Para  3   Term  3   Preterm      AB  2   Living  2     SAB      IAB      Ectopic      Multiple      Live Births              Family History  Problem Relation Age of Onset  . Heart defect Son     Social History   Tobacco Use  . Smoking status: Never Smoker  . Smokeless tobacco: Never Used   Vaping Use  . Vaping Use: Never used  Substance Use Topics  . Alcohol use: No    Alcohol/week: 0.0 standard drinks    Comment: occ  . Drug use: No    Home Medications Prior to Admission medications   Medication Sig Start Date End Date Taking? Authorizing Provider  amoxicillin-clavulanate (AUGMENTIN) 875-125 MG tablet Take 1 tablet by mouth 2 (two) times daily. One po bid x 7 days 04/07/20  Yes Melene Plan, DO  fluticasone [facility]) 50 MCG/ACT nasal spray Place 1 spray into both nostrils daily.    [provider]  loratadine (CLARITIN) 10 MG tablet Take 10 mg by mouth daily.    [provider]  medroxyPROGESTERone (DEPO-PROVERA) 150 MG/ML injection Inject 1 mL (150 mg total) into the muscle every 3 (three) months. 03/20/20   [name], NP    Allergies    Patient has no known allergies.  Review of Systems   Review of Systems  Constitutional: Negative for chills and fever.  HENT: Positive for congestion, rhinorrhea, sinus pressure and sinus  pain.   Eyes: Negative for redness and visual disturbance.  Respiratory: Negative for shortness of breath and wheezing.   Cardiovascular: Negative for chest pain and palpitations.  Gastrointestinal: Negative for nausea and vomiting.  Genitourinary: Negative for dysuria and urgency.  Musculoskeletal: Negative for arthralgias and myalgias.  Skin: Negative for pallor and wound.  Neurological: Positive for headaches. Negative for dizziness.    Physical Exam Updated Vital Signs BP (!) 146/95   Pulse 100   Temp 98.3 F (36.8 C) (Oral)   Resp 17   Ht 5\' 7"  (1.702 m)   Wt 66.7 kg   SpO2 100%   BMI 23.02 kg/m   Physical Exam Vitals and nursing note reviewed.  Constitutional:      General: She is not in acute distress.    Appearance: She is well-developed. She is not diaphoretic.  HENT:     Head: Normocephalic and atraumatic.     Comments: Swollen turbinates, posterior nasal drip, right frontal sinus tender to  percussion Eyes:     Pupils: Pupils are equal, round, and reactive to light.  Cardiovascular:     Rate and Rhythm: Normal rate and regular rhythm.     Heart sounds: No murmur heard. No friction rub. No gallop.   Pulmonary:     Effort: Pulmonary effort is normal.     Breath sounds: No wheezing or rales.  Abdominal:     General: There is no distension.     Palpations: Abdomen is soft.     Tenderness: There is no abdominal tenderness.  Musculoskeletal:        General: No tenderness.     Cervical back: Normal range of motion and neck supple.  Skin:    General: Skin is warm and dry.  Neurological:     Mental Status: She is alert and oriented to person, place, and time.  Psychiatric:        Behavior: Behavior normal.     ED Results / Procedures / Treatments   Labs (all labs ordered are listed, but only abnormal results are displayed) Labs Reviewed - No data to display  EKG None  Radiology No results found.  Procedures Procedures   Medications Ordered in ED Medications  amoxicillin-clavulanate (AUGMENTIN) 875-125 MG per tablet 1 tablet (has no administration in time range)  acetaminophen (TYLENOL) tablet 650 mg (650 mg Oral Given 04/07/20 0813)    ED Course  I have reviewed the triage vital signs and the nursing notes.  Pertinent labs & imaging results that were available during my care of the patient were reviewed by me and considered in my medical decision making (see chart for details).    MDM Rules/Calculators/A&P                          45 yo F with a chief complaints of a sinus headache.  Going on for the past week or so.  Timing seems most consistent with seasonal allergies though as the patient is having severe pain will start on a course of antibiotics.  She is already taking antihistamines orally and she is using a nasal steroid.  PCP follow-up.  9:21 AM:  I have discussed the diagnosis/risks/treatment options with the patient and believe the pt to be  eligible for discharge home to follow-up with PCP. We also discussed returning to the ED immediately if new or worsening sx occur. We discussed the sx which are most concerning (e.g., sudden worsening pain, fever,  inability to tolerate by mouth) that necessitate immediate return. Medications administered to the patient during their visit and any new prescriptions provided to the patient are listed below.  Medications given during this visit Medications  amoxicillin-clavulanate (AUGMENTIN) 875-125 MG per tablet 1 tablet (has no administration in time range)  acetaminophen (TYLENOL) tablet 650 mg (650 mg Oral Given 04/07/20 0813)     The patient appears reasonably screen and/or stabilized for discharge and I doubt any other medical condition or other [facility] requiring further screening, evaluation, or treatment in the ED at this time prior to discharge.   Final Clinical Impression(s) / ED Diagnoses Final diagnoses:  Acute maxillary sinusitis, recurrence not specified    Rx / DC Orders ED Discharge Orders         Ordered    amoxicillin-clavulanate (AUGMENTIN) 875-125 MG tablet  2 times daily        04/07/20 0907           Melene Plan, DO 04/07/20 [phone]

## 2020-04-29 ENCOUNTER — Other Ambulatory Visit: Payer: BLUE CROSS/BLUE SHIELD | Admitting: Obstetrics and Gynecology

## 2020-04-29 ENCOUNTER — Other Ambulatory Visit: Payer: BLUE CROSS/BLUE SHIELD

## 2020-06-06 ENCOUNTER — Ambulatory Visit (INDEPENDENT_AMBULATORY_CARE_PROVIDER_SITE_OTHER): Payer: BLUE CROSS/BLUE SHIELD | Admitting: *Deleted

## 2020-06-06 ENCOUNTER — Other Ambulatory Visit: Payer: Self-pay

## 2020-06-06 DIAGNOSIS — Z3042 Encounter for surveillance of injectable contraceptive: Secondary | ICD-10-CM

## 2020-06-06 MED ORDER — MEDROXYPROGESTERONE ACETATE 150 MG/ML IM SUSP
150.0000 mg | Freq: Once | INTRAMUSCULAR | Status: AC
  Administered 2020-06-06: 150 mg via INTRAMUSCULAR

## 2020-09-04 ENCOUNTER — Other Ambulatory Visit: Payer: Self-pay

## 2020-09-04 ENCOUNTER — Ambulatory Visit: Payer: Self-pay | Admitting: Gynecology

## 2020-09-04 DIAGNOSIS — Z3042 Encounter for surveillance of injectable contraceptive: Secondary | ICD-10-CM

## 2020-09-04 MED ORDER — MEDROXYPROGESTERONE ACETATE 150 MG/ML IM SUSP
150.0000 mg | Freq: Once | INTRAMUSCULAR | Status: AC
  Administered 2020-09-04: 150 mg via INTRAMUSCULAR

## 2020-11-26 ENCOUNTER — Ambulatory Visit (INDEPENDENT_AMBULATORY_CARE_PROVIDER_SITE_OTHER): Payer: BLUE CROSS/BLUE SHIELD | Admitting: *Deleted

## 2020-11-26 ENCOUNTER — Other Ambulatory Visit: Payer: Self-pay

## 2020-11-26 DIAGNOSIS — Z3042 Encounter for surveillance of injectable contraceptive: Secondary | ICD-10-CM | POA: Diagnosis not present

## 2020-11-26 MED ORDER — MEDROXYPROGESTERONE ACETATE 150 MG/ML IM SUSP
150.0000 mg | Freq: Once | INTRAMUSCULAR | Status: AC
  Administered 2020-11-26: 150 mg via INTRAMUSCULAR

## 2021-02-17 ENCOUNTER — Other Ambulatory Visit: Payer: Self-pay

## 2021-02-17 ENCOUNTER — Ambulatory Visit (INDEPENDENT_AMBULATORY_CARE_PROVIDER_SITE_OTHER): Payer: BLUE CROSS/BLUE SHIELD | Admitting: *Deleted

## 2021-02-17 DIAGNOSIS — Z3042 Encounter for surveillance of injectable contraceptive: Secondary | ICD-10-CM | POA: Diagnosis not present

## 2021-02-17 MED ORDER — MEDROXYPROGESTERONE ACETATE 150 MG/ML IM SUSP
150.0000 mg | Freq: Once | INTRAMUSCULAR | Status: AC
  Administered 2021-02-17: 150 mg via INTRAMUSCULAR

## 2021-03-23 NOTE — Progress Notes (Deleted)
? ?  KALENNA MILLETT [birth year] [id number] ? ? ?History:  46 y.o. [id number] presents for annual exam. Amenorrheic/Depo Provera. Normal pap history. H/O 53 RSO for cyst.   ? ?Gynecologic History ?No LMP recorded. Patient has had an injection. ?  ?Contraception/Family planning: Depo-Provera injections ?Sexually active: *** ? ?Health Maintenance ?Last Pap: 12/07/2017. Results were: Normal ?Last mammogram: Never  ?Last colonoscopy: Not indicated ?Last Dexa: Not indicated ? ?Past medical history, past surgical history, family history and social history were all reviewed and documented in the EPIC chart. Works for [employer]. 2 sons ages 35 and 47. ? ?ROS:  A ROS was performed and pertinent positives and negatives are included. ? ?Exam: ? ?There were no vitals filed for this visit. ? ?There is no height or weight on file to calculate BMI. ? ?General appearance:  Normal ?Thyroid:  Symmetrical, normal in size, without palpable masses or nodularity. ?Respiratory ? Auscultation:  Clear without wheezing or rhonchi ?Cardiovascular ? Auscultation:  Regular rate, without rubs, murmurs or gallops ? Edema/varicosities:  Not grossly evident ?Abdominal ? Soft,nontender, without masses, guarding or rebound. ? Liver/spleen:  No organomegaly noted ? Hernia:  None appreciated ? Skin ? Inspection:  Grossly normal ?  ?Breasts: Examined lying and sitting.  ? Right: Without masses, retractions, discharge or axillary adenopathy. ? ? Left: Without masses, retractions, discharge or axillary adenopathy. ?Genitourinary  ? Inguinal/mons:  Normal without inguinal adenopathy ? External genitalia:  Normal appearing vulva with no masses, tenderness, or lesions ? BUS/Urethra/Skene's glands:  Normal ? Vagina:  Normal appearing with normal color and discharge, no lesions ? Cervix:  Normal appearing without discharge or lesions ? Uterus:  Normal in size, shape and contour.  Midline and mobile, nontender ? Adnexa/parametria:   ?  Rt: Normal in size, without  masses or tenderness. ?  Lt: Normal in size, without masses or tenderness. ? Anus and perineum: Normal ? Digital rectal exam: Normal sphincter tone without palpated masses or tenderness ? ?Patient informed chaperone available to be present for breast and pelvic exam. Patient has requested no chaperone to be present. Patient has been advised what will be completed during breast and pelvic exam.  ? ?Assessment/Plan:  46 y.o. [id number] for annual exam.  ? ? ? ?Screening for cervical cancer - Normal Pap history.  Will repeat at 5-year interval per guidelines. ? ?Screening for breast cancer - Has not had screening mammogram. Discussed current guidelines and importance of preventative screenings. Information provided on [facility]. She plans to schedule this soon now that she has insurance. Normal breast exam today. ? ?Follow up in 1 year for annual.  ? ? ? ? ? ? ? ?[name] [facility], 2:33 PM 03/23/2021 ? ?

## 2021-03-24 ENCOUNTER — Ambulatory Visit: Payer: Self-pay | Admitting: Nurse Practitioner

## 2021-03-24 DIAGNOSIS — Z01419 Encounter for gynecological examination (general) (routine) without abnormal findings: Secondary | ICD-10-CM

## 2021-03-24 DIAGNOSIS — Z3042 Encounter for surveillance of injectable contraceptive: Secondary | ICD-10-CM

## 2021-03-24 DIAGNOSIS — Z0289 Encounter for other administrative examinations: Secondary | ICD-10-CM

## 2021-03-24 DIAGNOSIS — E7800 Pure hypercholesterolemia, unspecified: Secondary | ICD-10-CM

## 2021-04-27 ENCOUNTER — Encounter: Payer: Self-pay | Admitting: Anesthesiology

## 2021-04-27 ENCOUNTER — Ambulatory Visit: Payer: BLUE CROSS/BLUE SHIELD | Admitting: Nurse Practitioner

## 2021-05-04 ENCOUNTER — Ambulatory Visit (INDEPENDENT_AMBULATORY_CARE_PROVIDER_SITE_OTHER): Payer: BLUE CROSS/BLUE SHIELD | Admitting: Nurse Practitioner

## 2021-05-04 ENCOUNTER — Encounter: Payer: Self-pay | Admitting: Nurse Practitioner

## 2021-05-04 VITALS — BP 120/78 | Ht 66.0 in | Wt 168.0 lb

## 2021-05-04 DIAGNOSIS — Z01419 Encounter for gynecological examination (general) (routine) without abnormal findings: Secondary | ICD-10-CM

## 2021-05-04 DIAGNOSIS — E785 Hyperlipidemia, unspecified: Secondary | ICD-10-CM

## 2021-05-04 DIAGNOSIS — Z3042 Encounter for surveillance of injectable contraceptive: Secondary | ICD-10-CM | POA: Diagnosis not present

## 2021-05-04 DIAGNOSIS — Z1211 Encounter for screening for malignant neoplasm of colon: Secondary | ICD-10-CM | POA: Diagnosis not present

## 2021-05-04 DIAGNOSIS — Z113 Encounter for screening for infections with a predominantly sexual mode of transmission: Secondary | ICD-10-CM

## 2021-05-04 MED ORDER — MEDROXYPROGESTERONE ACETATE 150 MG/ML IM SUSP: 150.0000 mg | INTRAMUSCULAR | 3 refills | Status: DC

## 2021-05-04 NOTE — Progress Notes (Signed)
? ?Kerry Salazar 06-08-1975 [id number] ? ? ?History:  46 y.o. [id number] presents for annual exam. Amenorrheic/Depo Provera. Normal pap history. Has not had screening mammogram d/t work schedule. S/P 1998 RSO for cyst.  ? ?Gynecologic History ?No LMP recorded. Patient has had an injection. ?  ?Contraception/Family planning: Depo-Provera injections ?Sexually active: Yes ? ?Health Maintenance ?Last Pap: 12/07/2017. Results were: Normal, 5-year repeat ?Last mammogram: Never  ?Last colonoscopy: Never ?Last Dexa: N/A  ? ?Past medical history, past surgical history, family history and social history were all reviewed and documented in the EPIC chart. 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 20 yo son - struggling with drugs, has 4 mo baby, living with girlfriend and her dad. 60 yo son, into fashion, has YouTube channel.  ? ?ROS:  A ROS was performed and pertinent positives and negatives are included. ? ?Exam: ? ?Vitals:  ? 05/04/21 1456  ?BP: 120/78  ?Weight: 168 lb (76.2 kg)  ?Height: 5\' 6"  (1.676 m)  ? ? ?Body mass index is 27.12 kg/m?. ? ?General appearance:  Normal ?Thyroid:  Symmetrical, normal in size, without palpable masses or nodularity. ?Respiratory ? Auscultation:  Clear without wheezing or rhonchi ?Cardiovascular ? Auscultation:  Regular rate, without rubs, murmurs or gallops ? Edema/varicosities:  Not grossly evident ?Abdominal ? Soft,nontender, without masses, guarding or rebound. ? Liver/spleen:  No organomegaly noted ? Hernia:  None appreciated ? Skin ? Inspection:  Grossly normal ?  ?Breasts: Examined lying and sitting.  ? Right: Without masses, retractions, discharge or axillary adenopathy. ? ? Left: Without masses, retractions, discharge or axillary adenopathy. ?Genitourinary  ? Inguinal/mons:  Normal without inguinal adenopathy ? External genitalia:  Normal appearing vulva with no masses, tenderness, or lesions ? BUS/Urethra/Skene's glands:   Normal ? Vagina:  Normal appearing with normal color and discharge, no lesions ? Cervix:  Normal appearing without discharge or lesions ? Uterus:  Normal in size, shape and contour.  Midline and mobile, nontender ? Adnexa/parametria:   ?  Rt: Normal in size, without masses or tenderness. ?  Lt: Normal in size, without masses or tenderness. ? Anus and perineum: Normal ? Digital rectal exam: Normal sphincter tone without palpated masses or tenderness ? ?Patient informed chaperone available to be present for breast and pelvic exam. Patient has requested no chaperone to be present. Patient has been advised what will be completed during breast and pelvic exam.  ? ?Assessment/Plan:  46 y.o. [id number] for annual exam.  ? ?Well female exam with routine gynecological exam - Plan: CBC with Differential/Platelet, Comprehensive metabolic panel. Education provided on SBEs, importance of preventative screenings, current guidelines, high calcium diet, regular exercise, and multivitamin daily. Will return for fasting labs.  ? ?Encounter for surveillance of injectable contraceptive - Plan: medroxyPROGESTERone (DEPO-PROVERA) 150 MG/ML injection every 90 days. Amenorrheic.  ? ?Screening for colon cancer - Plan: Cologuard. No family h/o colon cancer, no personal risk factors. Candidate for Cologuard.  ? ?Hyperlipidemia, unspecified hyperlipidemia type - Plan: Lipid panel ? ?Screen for STD (sexually transmitted disease) - Plan: SURESWAB CT/NG/T. vaginalis, HIV Antibody (routine testing w rflx), RPR ? ?Screening for cervical cancer - Normal Pap history.  Will repeat at 5-year interval per guidelines. ? ?Screening for breast cancer - Has not had screening mammogram. Discussed current guidelines and importance of preventative screenings. Information provided on [facility]. Normal breast exam today. ? ?Follow up in 1 year for annual.  ? ? ? ? ? ? ? ?  Tamela Gammon [facility], 3:26 PM 05/04/2021 ? ?

## 2021-05-05 LAB — SURESWAB CT/NG/T. VAGINALIS
C. trachomatis RNA, TMA: NOT DETECTED
N. gonorrhoeae RNA, TMA: NOT DETECTED
Trichomonas vaginalis RNA: NOT DETECTED

## 2021-05-12 ENCOUNTER — Other Ambulatory Visit: Payer: BLUE CROSS/BLUE SHIELD

## 2021-05-12 ENCOUNTER — Ambulatory Visit (INDEPENDENT_AMBULATORY_CARE_PROVIDER_SITE_OTHER): Payer: BLUE CROSS/BLUE SHIELD | Admitting: *Deleted

## 2021-05-12 DIAGNOSIS — Z01419 Encounter for gynecological examination (general) (routine) without abnormal findings: Secondary | ICD-10-CM

## 2021-05-12 DIAGNOSIS — Z3042 Encounter for surveillance of injectable contraceptive: Secondary | ICD-10-CM

## 2021-05-12 DIAGNOSIS — E785 Hyperlipidemia, unspecified: Secondary | ICD-10-CM

## 2021-05-12 DIAGNOSIS — Z113 Encounter for screening for infections with a predominantly sexual mode of transmission: Secondary | ICD-10-CM

## 2021-05-12 MED ORDER — MEDROXYPROGESTERONE ACETATE 150 MG/ML IM SUSP
150.0000 mg | Freq: Once | INTRAMUSCULAR | Status: AC
  Administered 2021-05-12: 150 mg via INTRAMUSCULAR

## 2021-05-13 LAB — HIV ANTIBODY (ROUTINE TESTING W REFLEX): HIV 1&2 Ab, 4th Generation: NONREACTIVE

## 2021-05-13 LAB — COMPREHENSIVE METABOLIC PANEL
AG Ratio: 1.3 (calc) (ref 1.0–2.5)
ALT: 14 U/L (ref 6–29)
AST: 14 U/L (ref 10–35)
Albumin: 4 g/dL (ref 3.6–5.1)
Alkaline phosphatase (APISO): 73 U/L (ref 31–125)
BUN: 8 mg/dL (ref 7–25)
CO2: 25 mmol/L (ref 20–32)
Calcium: 9.1 mg/dL (ref 8.6–10.2)
Chloride: 108 mmol/L (ref 98–110)
Creat: 0.75 mg/dL (ref 0.50–0.99)
Globulin: 3.2 g/dL (calc) (ref 1.9–3.7)
Glucose, Bld: 91 mg/dL (ref 65–99)
Potassium: 4.2 mmol/L (ref 3.5–5.3)
Sodium: 142 mmol/L (ref 135–146)
Total Bilirubin: 0.3 mg/dL (ref 0.2–1.2)
Total Protein: 7.2 g/dL (ref 6.1–8.1)

## 2021-05-13 LAB — LIPID PANEL
Cholesterol: 205 mg/dL — ABNORMAL HIGH (ref <200)
HDL: 44 mg/dL — ABNORMAL LOW (ref >=50)
LDL Cholesterol (Calc): 143 mg/dL (calc) — ABNORMAL HIGH
Non-HDL Cholesterol (Calc): 161 mg/dL (calc) — ABNORMAL HIGH (ref <130)
Total CHOL/HDL Ratio: 4.7 (calc) (ref <5.0)
Triglycerides: 75 mg/dL (ref <150)

## 2021-05-13 LAB — CBC WITH DIFFERENTIAL/PLATELET
Absolute Monocytes: 539 cells/uL (ref 200–950)
Basophils Absolute: 62 cells/uL (ref 0–200)
Basophils Relative: 0.8 %
Eosinophils Absolute: 732 cells/uL — ABNORMAL HIGH (ref 15–500)
Eosinophils Relative: 9.5 %
HCT: 37.6 % (ref 35.0–45.0)
Hemoglobin: 12.6 g/dL (ref 11.7–15.5)
Lymphs Abs: 1671 cells/uL (ref 850–3900)
MCH: 28.5 pg (ref 27.0–33.0)
MCHC: 33.5 g/dL (ref 32.0–36.0)
MCV: 85.1 fL (ref 80.0–100.0)
MPV: 10.6 fL (ref 7.5–12.5)
Monocytes Relative: 7 %
Neutro Abs: 4697 cells/uL (ref 1500–7800)
Neutrophils Relative %: 61 %
Platelets: 386 10*3/uL (ref 140–400)
RBC: 4.42 10*6/uL (ref 3.80–5.10)
RDW: 13.5 % (ref 11.0–15.0)
Total Lymphocyte: 21.7 %
WBC: 7.7 10*3/uL (ref 3.8–10.8)

## 2021-05-13 LAB — RPR: RPR Ser Ql: NONREACTIVE

## 2021-07-31 ENCOUNTER — Ambulatory Visit: Payer: BLUE CROSS/BLUE SHIELD

## 2021-08-03 ENCOUNTER — Ambulatory Visit (INDEPENDENT_AMBULATORY_CARE_PROVIDER_SITE_OTHER): Payer: BLUE CROSS/BLUE SHIELD

## 2021-08-03 ENCOUNTER — Encounter: Payer: Self-pay | Admitting: Nurse Practitioner

## 2021-08-03 DIAGNOSIS — Z23 Encounter for immunization: Secondary | ICD-10-CM

## 2021-08-03 DIAGNOSIS — Z3042 Encounter for surveillance of injectable contraceptive: Secondary | ICD-10-CM

## 2021-08-03 MED ORDER — MEDROXYPROGESTERONE ACETATE 150 MG/ML IM SUSP
150.0000 mg | Freq: Once | INTRAMUSCULAR | Status: AC
  Administered 2021-08-03: 150 mg via INTRAMUSCULAR

## 2021-08-03 NOTE — Progress Notes (Signed)
Patient is here for Depo Provera Injection Patient is within Depo Provera Calender Limits yes last given 05/12/20  Next Depo Due between: 10/9-10/23  Last AEX: 05/04/21  AEX Scheduled: nothing scheduled   Patient is aware when next depo is due  Pt tolerated Injection well in LUOQ  Routed to provider for review, encounter closed.

## 2021-10-30 ENCOUNTER — Ambulatory Visit (INDEPENDENT_AMBULATORY_CARE_PROVIDER_SITE_OTHER): Payer: BLUE CROSS/BLUE SHIELD

## 2021-10-30 DIAGNOSIS — Z3042 Encounter for surveillance of injectable contraceptive: Secondary | ICD-10-CM | POA: Diagnosis not present

## 2021-10-30 MED ORDER — MEDROXYPROGESTERONE ACETATE 150 MG/ML IM SUSP
150.0000 mg | Freq: Once | INTRAMUSCULAR | Status: AC
  Administered 2021-10-30: 150 mg via INTRAMUSCULAR

## 2021-10-30 NOTE — Progress Notes (Signed)
Depo Provera 150mg  given IM RUOQ.  Patient tolerated injection well.  Next injection is due 01/15/22-01/29/22.  Patient supplied medication.

## 2021-12-05 ENCOUNTER — Other Ambulatory Visit: Payer: Self-pay

## 2021-12-05 ENCOUNTER — Encounter (HOSPITAL_COMMUNITY): Payer: Self-pay | Admitting: Emergency Medicine

## 2021-12-05 ENCOUNTER — Emergency Department (HOSPITAL_COMMUNITY)
Admission: EM | Admit: 2021-12-05 | Discharge: 2021-12-05 | Disposition: A | Discharge status: Home / Self Care | Payer: BLUE CROSS/BLUE SHIELD | Attending: Student | Admitting: Student

## 2021-12-05 DIAGNOSIS — J029 Acute pharyngitis, unspecified: Secondary | ICD-10-CM

## 2021-12-05 DIAGNOSIS — R030 Elevated blood-pressure reading, without diagnosis of hypertension: Secondary | ICD-10-CM | POA: Diagnosis not present

## 2021-12-05 DIAGNOSIS — Z1152 Encounter for screening for COVID-19: Secondary | ICD-10-CM | POA: Diagnosis not present

## 2021-12-05 DIAGNOSIS — J040 Acute laryngitis: Secondary | ICD-10-CM | POA: Diagnosis not present

## 2021-12-05 LAB — GROUP A STREP BY PCR: Group A Strep by PCR: NOT DETECTED

## 2021-12-05 LAB — SARS CORONAVIRUS 2 BY RT PCR: SARS Coronavirus 2 by RT PCR: NEGATIVE

## 2021-12-05 MED ORDER — LIDOCAINE VISCOUS HCL 2 % MT SOLN: 15.0000 mL | OROMUCOSAL | 0 refills | Status: AC | PRN

## 2021-12-05 NOTE — ED Triage Notes (Signed)
Patient here w/ loss of voice for one week, sore throat, w/ dry cough, runny nose, congestion. 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 Denies any known sick contacts. Denies fevers and chills.

## 2021-12-05 NOTE — ED Provider Triage Note (Signed)
Emergency Medicine Provider Triage Evaluation Note  Kerry Salazar , a 46 y.o. female  was evaluated in triage.  Pt complains of sore throat x 1 week. Works at the [employer], unsure about sick contacts. Worked out in the rain the other day and thinks that's what triggered her symptoms. Been trying mucinex without relief.    Review of Systems  Positive: Sore throat, hoarse voice, dry cough, runny nose, congestion Negative: Fever, chills  Physical Exam  BP (!) 159/91 (BP Location: Right Arm)   Pulse 95   Temp 98.6 F (37 C) (Oral)   Resp 16   SpO2 100%  Gen:   Awake, no distress   Resp:  Normal effort  MSK:   Moves extremities without difficulty  Other:    Medical Decision Making  Medically screening exam initiated at 4:44 PM.  Appropriate orders placed.  Kerry Salazar was informed that the remainder of the evaluation will be completed by another provider, this initial triage assessment does not replace that evaluation, and the importance of remaining in the ED until their evaluation is complete.     Su Monks, PA-C 12/05/21 1645

## 2021-12-05 NOTE — ED Provider Notes (Signed)
[facility] EMERGENCY DEPARTMENT Provider Note   CSN: [id number] Arrival date & time: 12/05/21  1544     History  Chief Complaint  Patient presents with   Sore Throat    Kerry Salazar is a 46 y.o. female with noncontributory past medical history who presents with concern for sore throat, dry cough, mild loss of appetite, as well as loss of voice for the last week or so. Patient denies chest pain, shob, fever, chills, persistent nausea, vomiting, diarrhea, constipation. She reports environment at work is very dry and irritating and seems to be exacerbating symptoms. Denies hx of asthma, COPD, reports she is tolerating her own saliva.   Sore Throat       Home Medications Prior to Admission medications   Medication Sig Start Date End Date Taking? Authorizing Provider  lidocaine (XYLOCAINE) 2 % solution Use as directed 15 mLs in the mouth or throat as needed (throat pain). 12/05/21  Yes Skylah Delauter H, PA-C  fluticasone (FLONASE) 50 MCG/ACT nasal spray Place 1 spray into both nostrils daily.    [provider]  loratadine (CLARITIN) 10 MG tablet Take 10 mg by mouth daily.    [provider]  medroxyPROGESTERone (DEPO-PROVERA) 150 MG/ML injection Inject 1 mL (150 mg total) into the muscle every 3 (three) months. 05/04/21   [name], NP      Allergies    Patient has no known allergies.    Review of Systems   Review of Systems  HENT:  Positive for sore throat and voice change.   All other systems reviewed and are negative.   Physical Exam Updated Vital Signs BP (!) 159/91 (BP Location: Right Arm)   Pulse 95   Temp 98.6 F (37 C) (Oral)   Resp 16   SpO2 100%  Physical Exam Vitals and nursing note reviewed.  Constitutional:      General: She is not in acute distress.    Appearance: Normal appearance.  HENT:     Head: Normocephalic and atraumatic.     Comments: Minimal posterior oropharynx erythema, without  swelling, exudate. Uvula midline, tonsils 1+ bilaterally.  No trismus, stridor, evidence of PTA, floor of mouth swelling or redness.   Hoarse / quiet voice.  Eyes:     General:        Right eye: No discharge.        Left eye: No discharge.  Cardiovascular:     Rate and Rhythm: Normal rate and regular rhythm.  Pulmonary:     Effort: Pulmonary effort is normal. No respiratory distress.  Musculoskeletal:        General: No deformity.     Cervical back: Neck supple.  Lymphadenopathy:     Cervical: No cervical adenopathy.  Skin:    General: Skin is warm and dry.  Neurological:     Mental Status: She is alert and oriented to person, place, and time.  Psychiatric:        Mood and Affect: Mood normal.        Behavior: Behavior normal.     ED Results / Procedures / Treatments   Labs (all labs ordered are listed, but only abnormal results are displayed) Labs Reviewed  GROUP A STREP BY PCR  SARS CORONAVIRUS 2 BY RT PCR    EKG None  Radiology No results found.  Procedures Procedures    Medications Ordered in ED Medications - No data to display  ED Course/ Medical Decision Making/ A&P  Medical Decision Making Risk Prescription drug management.   This is a well appearing 46 year old female with noncontributory PMH who presents with sore throat, dry cough, and voice change for the last week. My emergent differential diagnosis includes PTA, epiglottitis, uvulitis, ludwig angina, strep pharyngitis vs. Simple viral pharyngitis, laryngitis. On exam patient with minimally erythematous posterior oropharynx, with 1+ tonsils without unilateral swelling, exudate. Uvula midline.   Patient with stable vital signs other than mild elevated BP at 159/91. She does have a hoarse voice, but with normal appearance of floor of mouth, no TTP of anterior neck, no cervical LA, I am most suspicious for mild viral pharyngitis, laryngitis. I independently interpreted  labwork including RVP which is negative for COVID, PCR which is negative for strep.   Offered viscous lidocaine prn for sore throat sensation. Patient tolerating secretions without difficulty. No accessory heart or lung sounds, no respiratory distress. She is discharged with extensive return precautions, encouraged to follow up with PCP. Final Clinical Impression(s) / ED Diagnoses Final diagnoses:  Viral pharyngitis  Laryngitis    Rx / DC Orders ED Discharge Orders          Ordered    lidocaine (XYLOCAINE) 2 % solution  As needed        12/05/21 2241              Anselmo Pickler, PA-C 12/05/21 2248    Teressa Lower, MD 12/06/21 0151

## 2022-01-21 ENCOUNTER — Other Ambulatory Visit: Payer: Self-pay | Admitting: *Deleted

## 2022-01-21 DIAGNOSIS — Z3042 Encounter for surveillance of injectable contraceptive: Secondary | ICD-10-CM

## 2022-01-21 MED ORDER — MEDROXYPROGESTERONE ACETATE 150 MG/ML IM SUSP: 150.0000 mg | INTRAMUSCULAR | 0 refills | Status: DC

## 2022-01-21 NOTE — Telephone Encounter (Signed)
Medication refill request: depo- provera  Last AEX:  05-04-21 TW Next AEX: not currently scheduled  Last MMG (if hormonal medication request):n/a Refill authorized: Today, please advise.   Medication pended for #1, 0RF. Please refill if appropriate.

## 2022-01-25 ENCOUNTER — Ambulatory Visit (INDEPENDENT_AMBULATORY_CARE_PROVIDER_SITE_OTHER): Payer: BLUE CROSS/BLUE SHIELD

## 2022-01-25 DIAGNOSIS — Z3042 Encounter for surveillance of injectable contraceptive: Secondary | ICD-10-CM | POA: Diagnosis not present

## 2022-01-25 MED ORDER — MEDROXYPROGESTERONE ACETATE 150 MG/ML IM SUSP
150.0000 mg | Freq: Once | INTRAMUSCULAR | Status: AC
  Administered 2022-01-25: 150 mg via INTRAMUSCULAR

## 2022-04-19 ENCOUNTER — Emergency Department (HOSPITAL_COMMUNITY)
Admission: EM | Admit: 2022-04-19 | Discharge: 2022-04-19 | Disposition: A | Discharge status: Home / Self Care | Payer: BLUE CROSS/BLUE SHIELD | Attending: Emergency Medicine | Admitting: Emergency Medicine

## 2022-04-19 ENCOUNTER — Emergency Department (HOSPITAL_COMMUNITY): Payer: BLUE CROSS/BLUE SHIELD

## 2022-04-19 DIAGNOSIS — S61012A Laceration without foreign body of left thumb without damage to nail, initial encounter: Secondary | ICD-10-CM | POA: Diagnosis present

## 2022-04-19 DIAGNOSIS — I10 Essential (primary) hypertension: Secondary | ICD-10-CM | POA: Diagnosis not present

## 2022-04-19 DIAGNOSIS — S61412A Laceration without foreign body of left hand, initial encounter: Secondary | ICD-10-CM

## 2022-04-19 DIAGNOSIS — Z23 Encounter for immunization: Secondary | ICD-10-CM | POA: Insufficient documentation

## 2022-04-19 DIAGNOSIS — W260XXA Contact with knife, initial encounter: Secondary | ICD-10-CM | POA: Insufficient documentation

## 2022-04-19 MED ORDER — TETANUS-DIPHTH-ACELL PERTUSSIS 5-2.5-18.5 LF-MCG/0.5 IM SUSY
0.5000 mL | PREFILLED_SYRINGE | Freq: Once | INTRAMUSCULAR | Status: AC
  Administered 2022-04-19: 0.5 mL via INTRAMUSCULAR
  Filled 2022-04-19: qty 0.5

## 2022-04-19 MED ORDER — ACETAMINOPHEN 500 MG PO TABS
1000.0000 mg | ORAL_TABLET | Freq: Four times a day (QID) | ORAL | Status: DC | PRN
Start: 2022-04-19 — End: 2022-04-19

## 2022-04-19 MED ORDER — BACITRACIN ZINC 500 UNIT/GM EX OINT: TOPICAL_OINTMENT | Freq: Two times a day (BID) | CUTANEOUS | Status: DC

## 2022-04-19 NOTE — ED Triage Notes (Signed)
Patient here with complaint of injury to left hand after she accidentally stabbed her hand with a knife. Bleeding controlled, approximately 1cm by 0.25 cm laceration.

## 2022-04-19 NOTE — ED Provider Notes (Signed)
[place] EMERGENCY DEPARTMENT AT [facility] Provider Note   CSN: [id number] Arrival date & time: 04/19/22  1217     History  Chief Complaint  Patient presents with   Hand Injury    Kerry Salazar is a 47 y.o. female past medical history of hypertension presents today for evaluation of a laceration.  Patient states she was trying to open a can of soda this morning using a knife and accidentally cut her finger.  She has a 1 cm laceration at the base of the left thumb and the dorsal aspect.  Bleeding is controlled.  Denies any loss of sensation.  Patient is able to move her fingers and make a fist.  Has not tried any pain medication upon arrival.   Hand Injury     Past Medical History:  Diagnosis Date   Headache(784.0)    Hypertension    Past Surgical History:  Procedure Laterality Date   CESAREAN SECTION     OOPHORECTOMY  1998   RSO   OVARIAN CYST REMOVAL       Home Medications Prior to Admission medications   Medication Sig Start Date End Date Taking? Authorizing Provider  fluticasone (FLONASE) 50 MCG/ACT nasal spray Place 1 spray into both nostrils daily.    [provider]  lidocaine (XYLOCAINE) 2 % solution Use as directed 15 mLs in the mouth or throat as needed (throat pain). 12/05/21   Prosperi, Christian H, PA-C  loratadine (CLARITIN) 10 MG tablet Take 10 mg by mouth daily.    [provider]  medroxyPROGESTERone (DEPO-PROVERA) 150 MG/ML injection Inject 1 mL (150 mg total) into the muscle every 3 (three) months. 01/21/22   [name], NP      Allergies    Patient has no known allergies.    Review of Systems   Review of Systems Negative except as per HPI.  Physical Exam Updated Vital Signs BP (!) 155/95   Pulse 81   Temp 98.1 F (36.7 C)   Resp 15   SpO2 100%  Physical Exam Vitals and nursing note reviewed.  Constitutional:      Appearance: Normal appearance.  HENT:     Head: Normocephalic and  atraumatic.     Mouth/Throat:     Mouth: Mucous membranes are moist.  Eyes:     General: No scleral icterus. Cardiovascular:     Rate and Rhythm: Normal rate and regular rhythm.     Pulses: Normal pulses.     Heart sounds: Normal heart sounds.  Pulmonary:     Effort: Pulmonary effort is normal.     Breath sounds: Normal breath sounds.  Abdominal:     General: Abdomen is flat.     Palpations: Abdomen is soft.     Tenderness: There is no abdominal tenderness.  Musculoskeletal:        General: No deformity.  Skin:    General: Skin is warm.     Findings: No rash.     Comments: 1 cm laceration to the base of left thumb and the dorsal aspect.  Bleeding is controlled.  Laceration appears superficial.  No overt foreign body noted.  Neurological:     General: No focal deficit present.     Mental Status: She is alert.  Psychiatric:        Mood and Affect: Mood normal.     ED Results / Procedures / Treatments   Labs (all labs ordered are listed, but only abnormal results are  displayed) Labs Reviewed - No data to display  EKG None  Radiology No results found.  Procedures Procedures    Medications Ordered in ED Medications  acetaminophen (TYLENOL) tablet 1,000 mg (has no administration in time range)    ED Course/ Medical Decision Making/ A&P                             Medical Decision Making Amount and/or Complexity of Data Reviewed Radiology: ordered.  Risk OTC drugs.   This patient presents to the ED for laceration, this involves an extensive number of treatment options, and is a complaint that carries with a high risk of complications and morbidity.  The differential diagnosis includes laceration, fracture, dislocation.  This is not an exhaustive list.  Imaging studies: I ordered imaging studies. I personally reviewed, interpreted imaging and agree with the radiologist's interpretations. The results include: X-ray of left hand showed no acute osseous  abnormalities or radiopaque foreign body.  Problem list/ ED course/ Critical interventions/ Medical management: HPI: See above Vital signs within normal range and stable throughout visit. Laboratory/imaging studies significant for: See above. On physical examination, patient is afebrile and appears in no acute distress. Wound inspected under direct bright light with good visualization. Area with linear laceration across soft tissue above adipose without exposure of muscle belly or tendon. No overt foreign body. Area hemostatic. Neurovascular exam congruent with above. Area extensively irrigated with sterile normal saline under pressure. Band aid applied by nursing staff.. Patient tolerated procedure well and neurovascular exam intact and unchanged post repair with intact distal pulses and cap refill. Cautious return precautions discussed w/ full understanding. Wound care discussed. Prompt follow up with primary care physician discussed.  I have reviewed the patient home medicines and have made adjustments as needed.  Cardiac monitoring/EKG: The patient was maintained on a cardiac monitor.  I personally reviewed and interpreted the cardiac monitor which showed an underlying rhythm of: sinus rhythm.  Additional history obtained: External records from outside source obtained and reviewed including: Chart review including previous notes, labs, imaging.  Consultations obtained:  Disposition Continued outpatient therapy. Follow-up with PCP recommended for reevaluation of symptoms. Treatment plan discussed with patient.  Pt acknowledged understanding was agreeable to the plan. Worrisome signs and symptoms were discussed with patient, and patient acknowledged understanding to return to the ED if they noticed these signs and symptoms. Patient was stable upon discharge.   This chart was dictated using voice recognition software.  Despite best efforts to proofread,  errors can occur which can change the  documentation meaning.          Final Clinical Impression(s) / ED Diagnoses Final diagnoses:  Laceration of left hand without foreign body, initial encounter    Rx / DC Orders ED Discharge Orders     None         Jeanelle Malling, [place] 04/19/22 1423    Rolan Bucco, MD 04/19/22 [phone]

## 2022-04-19 NOTE — Discharge Instructions (Addendum)
Please take your medications as prescribed. Take tylenol/ibuprofen for pain.  You can also use ice/heat packs for symptom relief.  I recommend close follow-up with PCP for reevaluation.  Please do not hesitate to return to emergency department if worrisome signs symptoms we discussed become apparent.

## 2022-04-20 ENCOUNTER — Other Ambulatory Visit: Payer: Self-pay | Admitting: Nurse Practitioner

## 2022-04-20 DIAGNOSIS — Z3042 Encounter for surveillance of injectable contraceptive: Secondary | ICD-10-CM

## 2022-04-20 NOTE — Telephone Encounter (Signed)
AEX 05/04/2021 Scheduled AEX 05/06/2022

## 2022-04-21 ENCOUNTER — Ambulatory Visit (INDEPENDENT_AMBULATORY_CARE_PROVIDER_SITE_OTHER): Payer: BLUE CROSS/BLUE SHIELD

## 2022-04-21 DIAGNOSIS — Z3042 Encounter for surveillance of injectable contraceptive: Secondary | ICD-10-CM

## 2022-04-21 MED ORDER — MEDROXYPROGESTERONE ACETATE 150 MG/ML IM SUSY
150.0000 mg | PREFILLED_SYRINGE | Freq: Once | INTRAMUSCULAR | Status: AC
  Administered 2022-04-21: 150 mg via INTRAMUSCULAR

## 2022-05-06 ENCOUNTER — Ambulatory Visit: Payer: BLUE CROSS/BLUE SHIELD | Admitting: Nurse Practitioner

## 2023-10-17 ENCOUNTER — Telehealth: Payer: Self-pay

## 2023-10-24 ENCOUNTER — Encounter: Admitting: Family Medicine

## 2023-12-12 ENCOUNTER — Encounter: Admitting: Obstetrics and Gynecology
